# Patient Record
Sex: Female | Born: 1990 | Race: Black or African American | Hispanic: No | Marital: Single | Smoking: Never smoker
Health system: Southern US, Community
[De-identification: ages and names within clinical notes are randomized; demographics above are authoritative.]

## PROBLEM LIST (undated history)

## (undated) DIAGNOSIS — T7840XA Allergy, unspecified, initial encounter: Secondary | ICD-10-CM

## (undated) DIAGNOSIS — I10 Essential (primary) hypertension: Secondary | ICD-10-CM

## (undated) DIAGNOSIS — F419 Anxiety disorder, unspecified: Secondary | ICD-10-CM

## (undated) DIAGNOSIS — F32A Depression, unspecified: Secondary | ICD-10-CM

## (undated) HISTORY — PX: UPPER GASTROINTESTINAL ENDOSCOPY: SHX188

## (undated) HISTORY — DX: Allergy, unspecified, initial encounter: T78.40XA

## (undated) HISTORY — DX: Depression, unspecified: F32.A

## (undated) HISTORY — DX: Essential (primary) hypertension: I10

## (undated) HISTORY — DX: Anxiety disorder, unspecified: F41.9

---

## 2018-11-15 DIAGNOSIS — H5203 Hypermetropia, bilateral: Secondary | ICD-10-CM | POA: Diagnosis not present

## 2018-11-15 DIAGNOSIS — H52223 Regular astigmatism, bilateral: Secondary | ICD-10-CM | POA: Diagnosis not present

## 2018-12-25 DIAGNOSIS — Z01419 Encounter for gynecological examination (general) (routine) without abnormal findings: Secondary | ICD-10-CM | POA: Diagnosis not present

## 2018-12-25 DIAGNOSIS — Z1389 Encounter for screening for other disorder: Secondary | ICD-10-CM | POA: Diagnosis not present

## 2018-12-25 DIAGNOSIS — Z6832 Body mass index (BMI) 32.0-32.9, adult: Secondary | ICD-10-CM | POA: Diagnosis not present

## 2018-12-25 DIAGNOSIS — Z3202 Encounter for pregnancy test, result negative: Secondary | ICD-10-CM | POA: Diagnosis not present

## 2018-12-25 DIAGNOSIS — Z113 Encounter for screening for infections with a predominantly sexual mode of transmission: Secondary | ICD-10-CM | POA: Diagnosis not present

## 2018-12-25 DIAGNOSIS — Z124 Encounter for screening for malignant neoplasm of cervix: Secondary | ICD-10-CM | POA: Diagnosis not present

## 2019-01-22 ENCOUNTER — Ambulatory Visit: Payer: BC Managed Care – PPO | Attending: Internal Medicine

## 2019-01-22 ENCOUNTER — Other Ambulatory Visit: Payer: Self-pay

## 2019-01-22 DIAGNOSIS — Z20822 Contact with and (suspected) exposure to covid-19: Secondary | ICD-10-CM

## 2019-01-25 LAB — NOVEL CORONAVIRUS, NAA: SARS-CoV-2, NAA: DETECTED — AB

## 2019-08-20 DIAGNOSIS — Z6835 Body mass index (BMI) 35.0-35.9, adult: Secondary | ICD-10-CM | POA: Diagnosis not present

## 2019-08-20 DIAGNOSIS — Z124 Encounter for screening for malignant neoplasm of cervix: Secondary | ICD-10-CM | POA: Diagnosis not present

## 2019-08-20 DIAGNOSIS — Z113 Encounter for screening for infections with a predominantly sexual mode of transmission: Secondary | ICD-10-CM | POA: Diagnosis not present

## 2019-08-20 DIAGNOSIS — Z01419 Encounter for gynecological examination (general) (routine) without abnormal findings: Secondary | ICD-10-CM | POA: Diagnosis not present

## 2019-08-28 DIAGNOSIS — Z6835 Body mass index (BMI) 35.0-35.9, adult: Secondary | ICD-10-CM | POA: Diagnosis not present

## 2019-08-28 DIAGNOSIS — R102 Pelvic and perineal pain: Secondary | ICD-10-CM | POA: Diagnosis not present

## 2019-08-30 DIAGNOSIS — R10816 Epigastric abdominal tenderness: Secondary | ICD-10-CM | POA: Diagnosis not present

## 2019-08-30 DIAGNOSIS — F418 Other specified anxiety disorders: Secondary | ICD-10-CM | POA: Diagnosis not present

## 2019-08-30 DIAGNOSIS — Z13220 Encounter for screening for lipoid disorders: Secondary | ICD-10-CM | POA: Diagnosis not present

## 2019-08-30 DIAGNOSIS — F1211 Cannabis abuse, in remission: Secondary | ICD-10-CM | POA: Diagnosis not present

## 2019-08-30 DIAGNOSIS — R030 Elevated blood-pressure reading, without diagnosis of hypertension: Secondary | ICD-10-CM | POA: Diagnosis not present

## 2019-09-06 DIAGNOSIS — R1013 Epigastric pain: Secondary | ICD-10-CM | POA: Diagnosis not present

## 2019-09-06 DIAGNOSIS — K824 Cholesterolosis of gallbladder: Secondary | ICD-10-CM | POA: Diagnosis not present

## 2019-09-06 DIAGNOSIS — R10816 Epigastric abdominal tenderness: Secondary | ICD-10-CM | POA: Diagnosis not present

## 2019-09-27 DIAGNOSIS — Z23 Encounter for immunization: Secondary | ICD-10-CM | POA: Diagnosis not present

## 2019-09-27 DIAGNOSIS — R10816 Epigastric abdominal tenderness: Secondary | ICD-10-CM | POA: Diagnosis not present

## 2019-09-27 DIAGNOSIS — Z20822 Contact with and (suspected) exposure to covid-19: Secondary | ICD-10-CM | POA: Diagnosis not present

## 2019-10-11 ENCOUNTER — Other Ambulatory Visit: Payer: Self-pay | Admitting: Physician Assistant

## 2019-10-11 DIAGNOSIS — R1013 Epigastric pain: Secondary | ICD-10-CM

## 2019-10-11 DIAGNOSIS — R1030 Lower abdominal pain, unspecified: Secondary | ICD-10-CM

## 2019-10-11 DIAGNOSIS — R748 Abnormal levels of other serum enzymes: Secondary | ICD-10-CM | POA: Diagnosis not present

## 2019-10-14 ENCOUNTER — Encounter (HOSPITAL_COMMUNITY): Payer: Self-pay | Admitting: Emergency Medicine

## 2019-10-14 ENCOUNTER — Emergency Department (HOSPITAL_COMMUNITY)
Admission: EM | Admit: 2019-10-14 | Discharge: 2019-10-15 | Disposition: A | Discharge status: Home / Self Care | Payer: BC Managed Care – PPO | Attending: Emergency Medicine | Admitting: Emergency Medicine

## 2019-10-14 ENCOUNTER — Other Ambulatory Visit: Payer: Self-pay

## 2019-10-14 DIAGNOSIS — R1084 Generalized abdominal pain: Secondary | ICD-10-CM

## 2019-10-14 DIAGNOSIS — R109 Unspecified abdominal pain: Secondary | ICD-10-CM | POA: Diagnosis not present

## 2019-10-14 LAB — LIPASE, BLOOD: Lipase: 23 U/L (ref 11–51)

## 2019-10-14 LAB — COMPREHENSIVE METABOLIC PANEL
ALT: 17 U/L (ref 0–44)
AST: 18 U/L (ref 15–41)
Albumin: 3.7 g/dL (ref 3.5–5.0)
Alkaline Phosphatase: 65 U/L (ref 38–126)
Anion gap: 9 (ref 5–15)
BUN: 6 mg/dL (ref 6–20)
CO2: 26 mmol/L (ref 22–32)
Calcium: 9.2 mg/dL (ref 8.9–10.3)
Chloride: 106 mmol/L (ref 98–111)
Creatinine, Ser: 1 mg/dL (ref 0.44–1.00)
GFR calc Af Amer: >60 mL/min (ref >60)
GFR calc non Af Amer: >60 mL/min (ref >60)
Glucose, Bld: 120 mg/dL — ABNORMAL HIGH (ref 70–99)
Potassium: 3.6 mmol/L (ref 3.5–5.1)
Sodium: 141 mmol/L (ref 135–145)
Total Bilirubin: 0.6 mg/dL (ref 0.3–1.2)
Total Protein: 7.6 g/dL (ref 6.5–8.1)

## 2019-10-14 LAB — CBC
HCT: 39.6 % (ref 36.0–46.0)
Hemoglobin: 12.5 g/dL (ref 12.0–15.0)
MCH: 25.1 pg — ABNORMAL LOW (ref 26.0–34.0)
MCHC: 31.6 g/dL (ref 30.0–36.0)
MCV: 79.4 fL — ABNORMAL LOW (ref 80.0–100.0)
Platelets: 382 10*3/uL (ref 150–400)
RBC: 4.99 MIL/uL (ref 3.87–5.11)
RDW: 13.5 % (ref 11.5–15.5)
WBC: 5.2 10*3/uL (ref 4.0–10.5)
nRBC: 0 % (ref 0.0–0.2)

## 2019-10-14 LAB — I-STAT BETA HCG BLOOD, ED (MC, WL, AP ONLY): I-stat hCG, quantitative: <5 m[IU]/mL (ref <5)

## 2019-10-14 MED ORDER — ACETAMINOPHEN 325 MG PO TABS
650.0000 mg | ORAL_TABLET | Freq: Once | ORAL | Status: AC
  Administered 2019-10-14: 650 mg via ORAL
  Filled 2019-10-14: qty 2

## 2019-10-14 NOTE — ED Triage Notes (Signed)
Onset 3-4 months developed abdominal pain and recently pain worsening overtime. Seen doctor and blood work taken 3 days ago. Doctor called patient to go to the ED abnormal blood work Lipase 251. States had nausea only at this time.

## 2019-10-14 NOTE — ED Notes (Signed)
Went to give pt. Her Tylenol and another pt. Had already given her some Tylenol

## 2019-10-14 NOTE — ED Notes (Signed)
Pt. Requesting tylenol for pain

## 2019-10-15 ENCOUNTER — Emergency Department (HOSPITAL_COMMUNITY): Payer: BC Managed Care – PPO

## 2019-10-15 DIAGNOSIS — R109 Unspecified abdominal pain: Secondary | ICD-10-CM | POA: Diagnosis not present

## 2019-10-15 LAB — URINALYSIS, ROUTINE W REFLEX MICROSCOPIC
Bilirubin Urine: NEGATIVE
Glucose, UA: NEGATIVE mg/dL
Hgb urine dipstick: NEGATIVE
Ketones, ur: NEGATIVE mg/dL
Leukocytes,Ua: NEGATIVE
Nitrite: NEGATIVE
Protein, ur: NEGATIVE mg/dL
Specific Gravity, Urine: 1.025 (ref 1.005–1.030)
pH: 6 (ref 5.0–8.0)

## 2019-10-15 MED ORDER — IOHEXOL 350 MG/ML SOLN
100.0000 mL | Freq: Once | INTRAVENOUS | Status: AC | PRN
  Administered 2019-10-15: 100 mL via INTRAVENOUS

## 2019-10-15 MED ORDER — ACETAMINOPHEN 325 MG PO TABS
650.0000 mg | ORAL_TABLET | Freq: Once | ORAL | Status: AC
  Administered 2019-10-15: 650 mg via ORAL
  Filled 2019-10-15: qty 2

## 2019-10-15 NOTE — Discharge Instructions (Signed)
Please read and follow all provided instructions.  Your diagnoses today include:  1. Generalized abdominal pain     Tests performed today include:  Blood counts and electrolytes  Blood tests to check liver and kidney function  Blood tests to check pancreas function - was normal today  Urine test to look for infection and pregnancy (in women)  CT scan of the abdomen - shows some mildly swollen lymph nodes in the abdomen, otherwise the scan is normal  Vital signs. See below for your results today.   Medications prescribed:   None  Take any prescribed medications only as directed.  Home care instructions:   Follow any educational materials contained in this packet.  Follow-up instructions: Please follow-up with your primary care provider or GI doctor in the next 3 days for further evaluation of your symptoms.    Return instructions:  SEEK IMMEDIATE MEDICAL ATTENTION IF:  The pain does not go away or becomes severe   A temperature above 101F develops   Repeated vomiting occurs (multiple episodes)   The pain becomes localized to portions of the abdomen. The right side could possibly be appendicitis. In an adult, the left lower portion of the abdomen could be colitis or diverticulitis.   Blood is being passed in stools or vomit (bright red or black tarry stools)   You develop chest pain, difficulty breathing, dizziness or fainting, or become confused, poorly responsive, or inconsolable (young children)  If you have any other emergent concerns regarding your health  Additional Information: Abdominal (belly) pain can be caused by many things. Your caregiver performed an examination and possibly ordered blood/urine tests and imaging (CT scan, x-rays, ultrasound). Many cases can be observed and treated at home after initial evaluation in the emergency department. Even though you are being discharged home, abdominal pain can be unpredictable. Therefore, you need a repeated  exam if your pain does not resolve, returns, or worsens. Most patients with abdominal pain don't have to be admitted to the hospital or have surgery, but serious problems like appendicitis and gallbladder attacks can start out as nonspecific pain. Many abdominal conditions cannot be diagnosed in one visit, so follow-up evaluations are very important.  Your vital signs today were: BP (!) 154/114 (BP Location: Right Arm)   Pulse 99   Temp 98.5 F (36.9 C) (Oral)   Resp 12   Ht 5\' 4"  (1.626 m)   Wt 92.5 kg   SpO2 98%   BMI 35.02 kg/m  If your blood pressure (bp) was elevated above 135/85 this visit, please have this repeated by your doctor within one month. --------------

## 2019-10-15 NOTE — ED Provider Notes (Signed)
[facility] EMERGENCY DEPARTMENT Provider Note   CSN: [id number] Arrival date & time: 10/14/19  1547     History Chief Complaint  Patient presents with  . Abdominal Pain    Dorothy Osborne is a 29 y.o. female.  Patient with no past surgical history presents to the emergency department for evaluation of abdominal pain and elevated lipase.  Patient states that about a year ago she began having cramping in her lower abdomen.  Initially she went to her GYN who did a work-up including ultrasounds.  Working diagnosis was endometriosis per patient.  However several months ago patient began having upper abdominal pains as well.  She followed up with her PCP and then [facility].  In August patient states that she had a mildly elevated lipase level.  When she went to GI several days ago, this increased to 3 times the upper limit of normal.  She was encouraged to come to the emergency department for imaging.  Initially, they were going to do an outpatient CT head in the next 1 to 2 weeks.  Patient has had persistent abdominal pain described as cramping in the upper abdomen.  She has had nausea and decreased appetite but no persistent vomiting.  No chest pain or shortness of breath.  No urinary symptoms.  She states that the texture of her stool has been different recently and it has been lighter in color, but she has not noticed any black or bloody stools.  No treatments prior to arrival other than Tylenol.  Patient denies alcohol use or history of gallbladder disease.  She does not have a known history of high cholesterol or IBD.         History reviewed. No pertinent past medical history.  There are no problems to display for this patient.   History reviewed. No pertinent surgical history.   OB History   No obstetric history on file.     No family history on file.  Social History   Tobacco Use  . Smoking status: Never Smoker  Substance Use Topics  . Alcohol use: Never   . Drug use: Never    Home Medications Prior to Admission medications   Not on File    Allergies    Patient has no known allergies.  Review of Systems   Review of Systems  Constitutional: Positive for appetite change. Negative for fever.  HENT: Negative for rhinorrhea and sore throat.   Eyes: Negative for redness.  Respiratory: Negative for cough.   Cardiovascular: Negative for chest pain.  Gastrointestinal: Positive for abdominal pain and nausea. Negative for blood in stool, constipation, diarrhea and vomiting.  Genitourinary: Positive for pelvic pain. Negative for dysuria, frequency, hematuria, urgency, vaginal bleeding and vaginal discharge.  Musculoskeletal: Negative for myalgias.  Skin: Negative for rash.  Neurological: Negative for headaches.    Physical Exam Updated Vital Signs BP (!) 157/93 (BP Location: Right Arm)   Pulse 80   Temp 98.5 F (36.9 C) (Oral)   Resp 14   Ht 5\' 4"  (1.626 m)   Wt 92.5 kg   SpO2 100%   BMI 35.02 kg/m   Physical Exam Vitals and nursing note reviewed.  Constitutional:      General: She is not in acute distress.    Appearance: She is well-developed.  HENT:     Head: Normocephalic and atraumatic.     Right Ear: External ear normal.     Left Ear: External ear normal.  Nose: Nose normal.  Eyes:     Conjunctiva/sclera: Conjunctivae normal.  Cardiovascular:     Rate and Rhythm: Normal rate and regular rhythm.     Heart sounds: No murmur heard.   Pulmonary:     Effort: No respiratory distress.     Breath sounds: No wheezing, rhonchi or rales.  Abdominal:     Palpations: Abdomen is soft.     Tenderness: There is generalized abdominal tenderness (mild). There is no guarding or rebound.  Musculoskeletal:     Cervical back: Normal range of motion and neck supple.     Right lower leg: No edema.     Left lower leg: No edema.  Skin:    General: Skin is warm and dry.     Coloration: Skin is not jaundiced.     Findings: No rash.   Neurological:     General: No focal deficit present.     Mental Status: She is alert. Mental status is at baseline.     Motor: No weakness.  Psychiatric:        Mood and Affect: Mood normal.     ED Results / Procedures / Treatments   Labs (all labs ordered are listed, but only abnormal results are displayed) Labs Reviewed  COMPREHENSIVE METABOLIC PANEL - Abnormal; Notable for the following components:      Result Value   Glucose, Bld 120 (*)    All other components within normal limits  CBC - Abnormal; Notable for the following components:   MCV 79.4 (*)    MCH 25.1 (*)    All other components within normal limits  URINALYSIS, ROUTINE W REFLEX MICROSCOPIC - Abnormal; Notable for the following components:   APPearance HAZY (*)    All other components within normal limits  LIPASE, BLOOD  I-STAT BETA HCG BLOOD, ED ([facility], [facility], [facility] ONLY)    EKG None  Radiology CT ABDOMEN PELVIS W CONTRAST  Result Date: 10/15/2019 CLINICAL DATA:  Abdominal pain.  Intermittent lipase elevation EXAM: CT ABDOMEN AND PELVIS WITH CONTRAST TECHNIQUE: Multidetector CT imaging of the abdomen and pelvis was performed using the standard protocol following bolus administration of intravenous contrast. CONTRAST:  OMNIPAQUE IOHEXOL 350 MG/ML SOLN COMPARISON:  None. FINDINGS: Lower chest: Lung bases are clear. Hepatobiliary: No focal liver lesions are apparent. Gallbladder wall is not appreciably thickened. There is no biliary duct dilatation. Pancreas: There is no pancreatic mass or inflammatory focus. No peripancreatic fluid or duct dilatation. Spleen: No splenic lesions evident. Adrenals/Urinary Tract: Adrenals bilaterally appear normal. Kidneys bilaterally show no evident mass or hydronephrosis on either side. No renal or ureteral calculi are evident on either side. Urinary bladder is midline with wall thickness within normal limits. Stomach/Bowel: No appreciable bowel wall or mesenteric thickening. Terminal  ileum appears normal. There is fatty infiltration in the ileocecal valve. No evident bowel obstruction. There is no appreciable free air or portal venous air. Vascular/Lymphatic: No abdominal aortic aneurysm. No arterial vascular lesions are demonstrable. Major venous structures appear patent. There is no evident adenopathy in the abdomen or pelvis by size criteria. There are subcentimeter lymph nodes in the mid right abdomen, considered nonspecific. Subcentimeter inguinal lymph nodes also noted, considered nonspecific. Reproductive: The uterus is anteverted. There is a probable dominant follicle in the right ovary measuring 1.2 x 1.2 cm. Beyond this apparent follicle, there is no pelvic mass. Other: The appendix appears normal. No abscess or ascites is evident in the abdomen or pelvis. Slight fat noted in the  umbilicus. Musculoskeletal: No blastic or lytic bone lesions. No intramuscular or abdominal wall lesions are evident. IMPRESSION: 1. Subcentimeter mesenteric lymph nodes, considered nonspecific. In the appropriate clinical setting, this finding potentially could indicate a degree of mesenteric adenitis. Note that by size criteria there is no adenopathy in the abdomen or pelvis. 2. No bowel wall thickening or bowel obstruction. Appendix appears normal. No abscess evident in the abdomen or pelvis. 3. No renal or ureteral calculus. No hydronephrosis on either side. Urinary bladder wall thickness normal. Electronically Signed   By: Bretta Bang III M.D.   On: 10/15/2019 10:51    Procedures Procedures (including critical care time)  Medications Ordered in ED Medications  acetaminophen (TYLENOL) tablet 650 mg (has no administration in time range)  acetaminophen (TYLENOL) tablet 650 mg (650 mg Oral Given 10/14/19 2007)    ED Course  I have reviewed the triage vital signs and the nursing notes.  Pertinent labs & imaging results that were available during my care of the patient were reviewed by me  and considered in my medical decision making (see chart for details).  Patient seen and examined. Work-up reviewed.  Lipase here was normal.  Patient continues to have complaints of abdominal pain.  She does not want anything other than Tylenol for this.  Will obtain CT imaging of the abdomen and pelvis with contrast.  Fortunately, labs look good here.  She is established with [facility] and has plans to follow-up.   Vital signs reviewed and are as follows: BP (!) 157/93 (BP Location: Right Arm)   Pulse 80   Temp 98.5 F (36.9 C) (Oral)   Resp 14   Ht 5\' 4"  (1.626 m)   Wt 92.5 kg   SpO2 100%   BMI 35.02 kg/m   11:41 AM CT personally reviewed.  CT scan is largely reassuring.  Patient informed of results.  She seems reassured.  Pain is currently controlled and she appears comfortable.  Current plan is for discharge and to continue follow-up as outpatient with GI and PCP.  The patient was urged to return to the Emergency Department immediately with worsening of current symptoms, worsening abdominal pain, persistent vomiting, blood noted in stools, fever, or any other concerns. The patient verbalized understanding.     MDM Rules/Calculators/A&P                          Patient with ongoing nonspecific abdominal pain, sent today for imaging given recent elevation and lipase.  Lipase was normal here today.  Remainder of lab work was reassuring.  CT imaging without any concerning findings.  No indications for admission, emergent consultation, or other inpatient treatments here today.  Patient will continue close follow-up as outpatient.  Discussed return instructions as above.   Final Clinical Impression(s) / ED Diagnoses Final diagnoses:  Generalized abdominal pain    Rx / DC Orders ED Discharge Orders    None       , PA-C 10/15/19 1142    10/17/19, MD 10/15/19 1539

## 2019-10-24 ENCOUNTER — Other Ambulatory Visit: Payer: BC Managed Care – PPO

## 2020-10-17 HISTORY — PX: LAPAROSCOPY: SHX197

## 2021-01-05 IMAGING — CT CT ABD-PELV W/ CM
2 of 4 series · 16 of 46 positions shown, 18 images · IV contrast (omnipaque)
Comparison: None.

CLINICAL DATA: Abdominal pain.  Intermittent lipase elevation

EXAM:
CT ABDOMEN AND PELVIS WITH CONTRAST
TECHNIQUE: Multidetector CT imaging of the abdomen and pelvis was performed
using the standard protocol following bolus administration of
intravenous contrast.
CONTRAST:  100mL OMNIPAQUE IOHEXOL 350 MG/ML SOLN

[Series 3: a/p w/ 5mm · axial · 0.90mm/px · z∈[+930,+1350]mm · 13 of 92 slices shown, 15 images]
[im 4/92  soft-tissue]
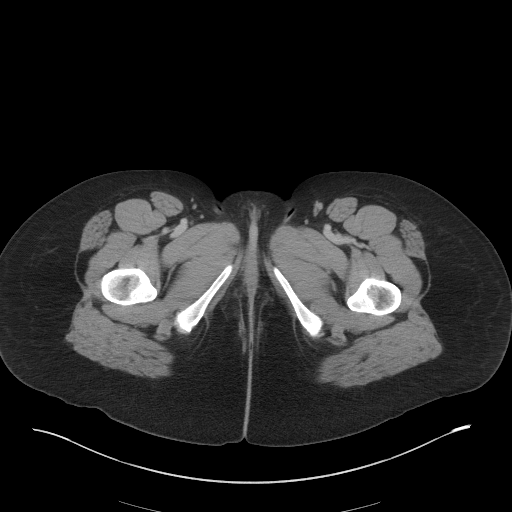
[im 4/92  bone]
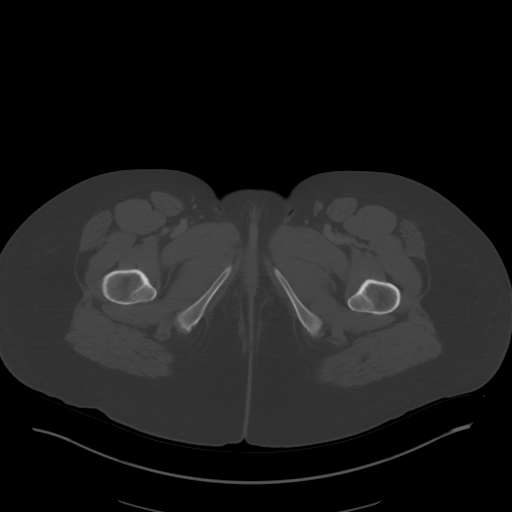
[im 12/92  soft-tissue]
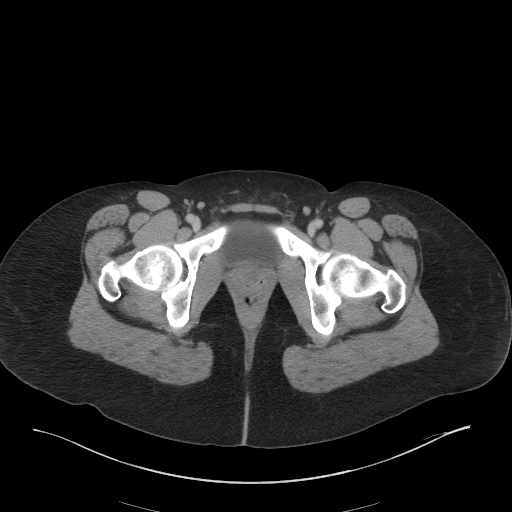
[im 19/92  soft-tissue]
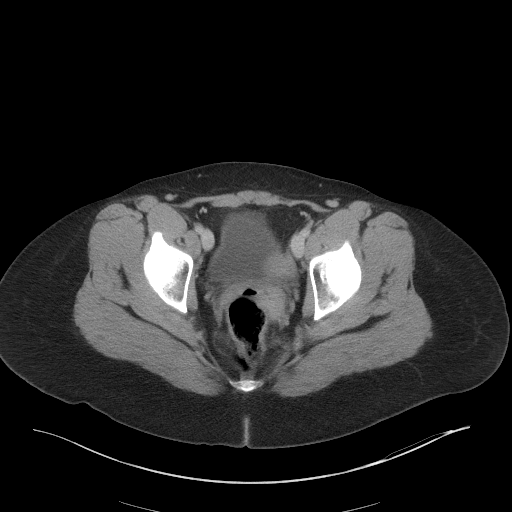
[im 27/92  soft-tissue]
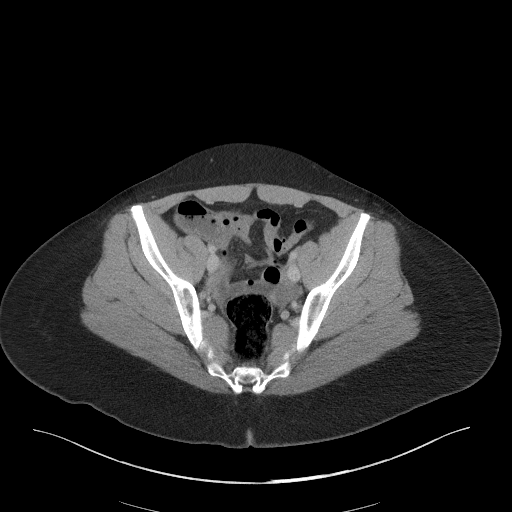
[im 31/92  soft-tissue]
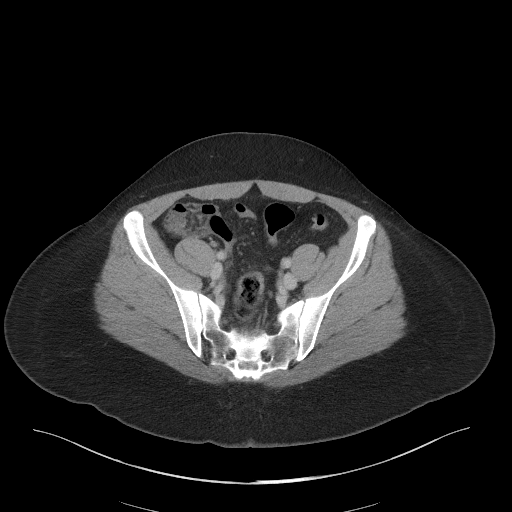
[im 38/92  soft-tissue]
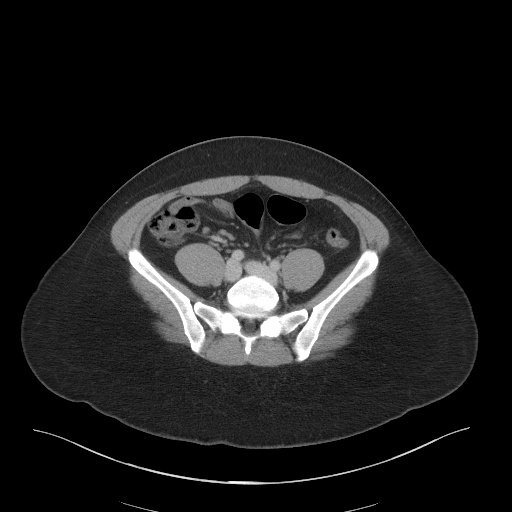
[im 46/92  soft-tissue]
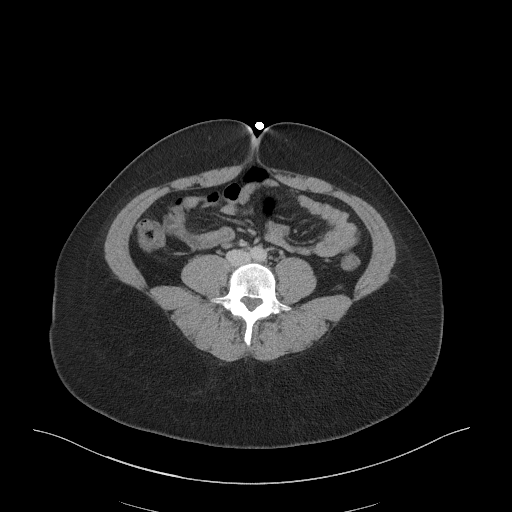
[im 54/92  soft-tissue]
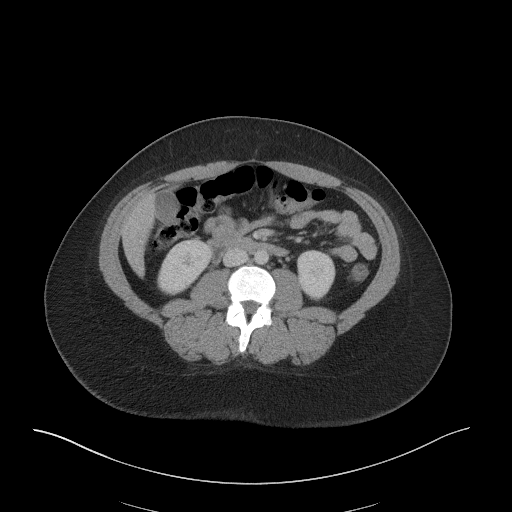
[im 61/92  soft-tissue]
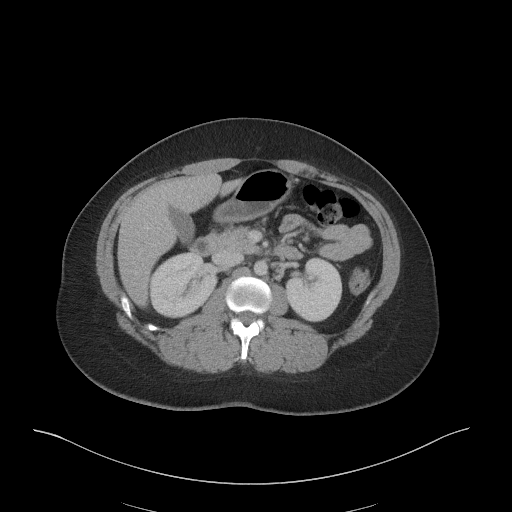
[im 61/92  bone]
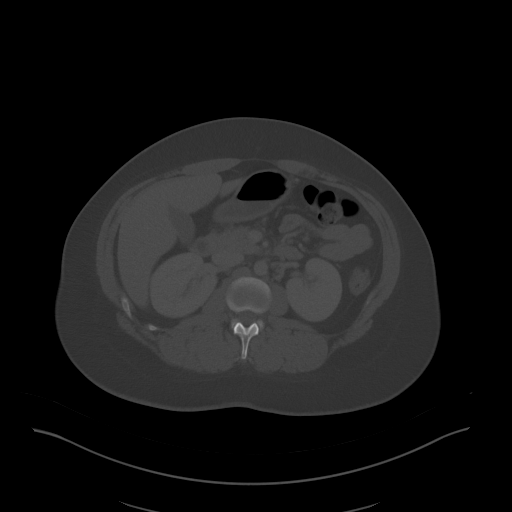
[im 65/92  soft-tissue]
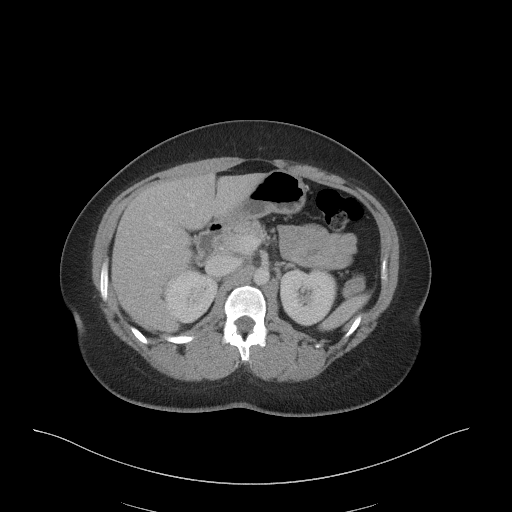
[im 73/92  soft-tissue]
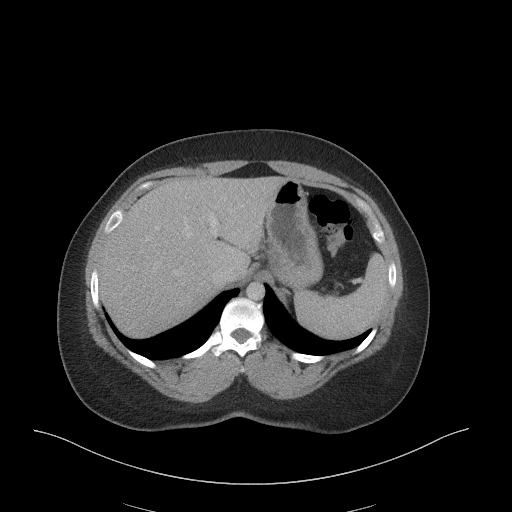
[im 80/92  soft-tissue]
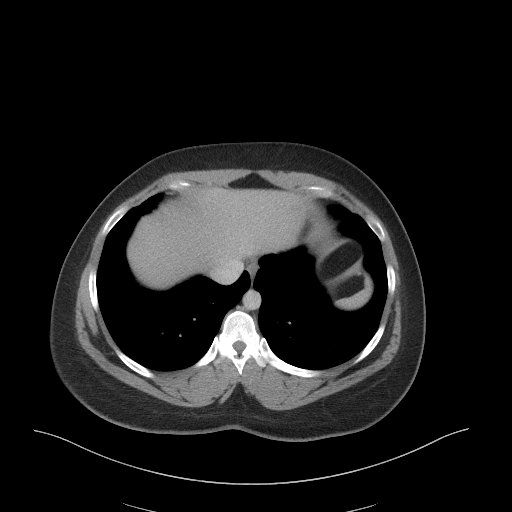
[im 88/92  soft-tissue]
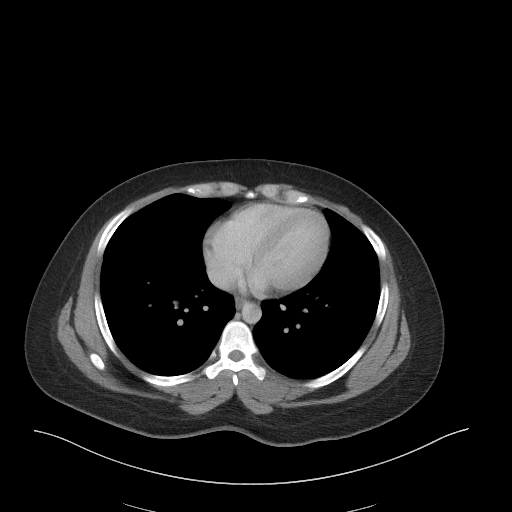

[Series 6: a/p w/ cor · coronal · 0.89mm/px · 3 of 151 slices shown]
[im 51/151  soft-tissue]
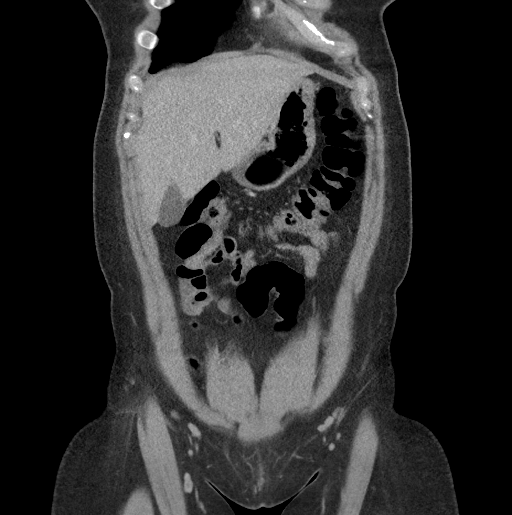
[im 67/151  soft-tissue]
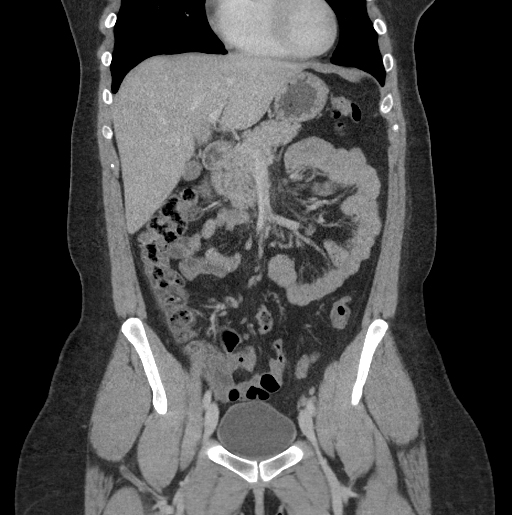
[im 84/151  soft-tissue]
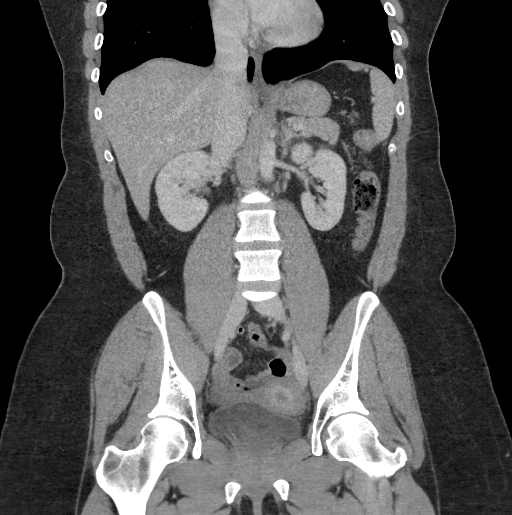

[16 of 46 positions shown; findings below may reference images not displayed]

FINDINGS: Lower chest: Lung bases are clear.

Hepatobiliary: No focal liver lesions are apparent. Gallbladder wall
is not appreciably thickened. There is no biliary duct dilatation.

Pancreas: There is no pancreatic mass or inflammatory focus. No
peripancreatic fluid or duct dilatation.

Spleen: No splenic lesions evident.

Adrenals/Urinary Tract: Adrenals bilaterally appear normal. Kidneys
bilaterally show no evident mass or hydronephrosis on either side.
No renal or ureteral calculi are evident on either side. Urinary
bladder is midline with wall thickness within normal limits.

Stomach/Bowel: No appreciable bowel wall or mesenteric thickening.
Terminal ileum appears normal. There is fatty infiltration in the
ileocecal valve. No evident bowel obstruction. There is no
appreciable free air or portal venous air.

Vascular/Lymphatic: No abdominal aortic aneurysm. No arterial
vascular lesions are demonstrable. Major venous structures appear
patent. There is no evident adenopathy in the abdomen or pelvis by
size criteria. There are subcentimeter lymph nodes in the mid right
abdomen, considered nonspecific. Subcentimeter inguinal lymph nodes
also noted, considered nonspecific.

Reproductive: The uterus is anteverted. There is a probable dominant
follicle in the right ovary measuring 1.2 x 1.2 cm. Beyond this
apparent follicle, there is no pelvic mass.

Other: The appendix appears normal. No abscess or ascites is evident
in the abdomen or pelvis. Slight fat noted in the umbilicus.

Musculoskeletal: No blastic or lytic bone lesions. No intramuscular
or abdominal wall lesions are evident.
IMPRESSION: 1. Subcentimeter mesenteric lymph nodes, considered nonspecific. In
the appropriate clinical setting, this finding potentially could
indicate a degree of mesenteric adenitis. Note that by size criteria
there is no adenopathy in the abdomen or pelvis.

2. No bowel wall thickening or bowel obstruction. Appendix appears
normal. No abscess evident in the abdomen or pelvis.

3. No renal or ureteral calculus. No hydronephrosis on either side.
Urinary bladder wall thickness normal.

## 2021-02-25 LAB — HM PAP SMEAR

## 2021-03-10 ENCOUNTER — Encounter: Payer: Self-pay | Admitting: Nurse Practitioner

## 2021-03-10 ENCOUNTER — Other Ambulatory Visit: Payer: Self-pay

## 2021-03-10 ENCOUNTER — Ambulatory Visit: Payer: Managed Care, Other (non HMO) | Admitting: Nurse Practitioner

## 2021-03-10 VITALS — BP 100/64 | HR 92 | Temp 97.7°F | Ht 64.5 in | Wt 194.6 lb

## 2021-03-10 DIAGNOSIS — B9681 Helicobacter pylori [H. pylori] as the cause of diseases classified elsewhere: Secondary | ICD-10-CM

## 2021-03-10 DIAGNOSIS — J301 Allergic rhinitis due to pollen: Secondary | ICD-10-CM

## 2021-03-10 DIAGNOSIS — K2970 Gastritis, unspecified, without bleeding: Secondary | ICD-10-CM

## 2021-03-10 DIAGNOSIS — R109 Unspecified abdominal pain: Secondary | ICD-10-CM | POA: Diagnosis not present

## 2021-03-10 DIAGNOSIS — G8929 Other chronic pain: Secondary | ICD-10-CM | POA: Diagnosis not present

## 2021-03-10 DIAGNOSIS — E282 Polycystic ovarian syndrome: Secondary | ICD-10-CM | POA: Insufficient documentation

## 2021-03-10 MED ORDER — OMEPRAZOLE 20 MG PO CPDR: 20.0000 mg | DELAYED_RELEASE_CAPSULE | Freq: Two times a day (BID) | ORAL | 0 refills | Status: DC

## 2021-03-10 MED ORDER — AZELASTINE HCL 0.1 % NA SOLN: 1.0000 | Freq: Two times a day (BID) | NASAL | 5 refills | Status: DC

## 2021-03-10 NOTE — Patient Instructions (Signed)
Thank you for choosing [facility]  Go to lab for breath test  Start omeprazole and azelastine as prescribed  You will be contacted to schedule appt with GI and allergist.  Food Choices for Gastroesophageal Reflux Disease, Adult When you have gastroesophageal reflux disease (GERD), the foods you eat and your eating habits are very important. Choosing the right foods can help ease your discomfort. Think about working with a food expert (dietitian) to help you make good choices. What are tips for following this plan? Reading food labels Look for foods that are low in saturated fat. Foods that may help with your symptoms include: Foods that have less than 5% of daily value (DV) of fat. Foods that have 0 grams of trans fat. Cooking Do not fry your food. Cook your food by baking, steaming, grilling, or broiling. These are all methods that do not need a lot of fat for cooking. To add flavor, try to use herbs that are low in spice and acidity. Meal planning  Choose healthy foods that are low in fat, such as: Fruits and vegetables. Whole grains. Low-fat dairy products. Lean meats, fish, and poultry. Eat small meals often instead of eating 3 large meals each day. Eat your meals slowly in a place where you are relaxed. Avoid bending over or lying down until 2-3 hours after eating. Limit high-fat foods such as fatty meats or fried foods. Limit your intake of fatty foods, such as oils, butter, and shortening. Avoid the following as told by your doctor: Foods that cause symptoms. These may be different for different people. Keep a food diary to keep track of foods that cause symptoms. Alcohol. Drinking a lot of liquid with meals. Eating meals during the 2-3 hours before bed. Lifestyle Stay at a healthy weight. Ask your doctor what weight is healthy for you. If you need to lose weight, work with your doctor to do so safely. Exercise for at least 30 minutes on 5 or more days each week,  or as told by your doctor. Wear loose-fitting clothes. Do not smoke or use any products that contain nicotine or tobacco. If you need help quitting, ask your doctor. Sleep with the head of your bed higher than your feet. Use a wedge under the mattress or blocks under the bed frame to raise the head of the bed. Chew sugar-free gum after meals. What foods should eat? Eat a healthy, well-balanced diet of fruits, vegetables, whole grains, low-fat dairy products, lean meats, fish, and poultry. Each person is different. Foods that may cause symptoms in one person may not cause any symptoms in another person. Work with your doctor to find foods that are safe for you. The items listed above may not be a complete list of what you can eat and drink. Contact a food expert for more options. What foods should I avoid? Limiting some of these foods may help in managing the symptoms of GERD. Everyone is different. Talk with a food expert or your doctor to help you find the exact foods to avoid, if any. Fruits Any fruits prepared with added fat. Any fruits that cause symptoms. For some people, this may include citrus fruits, such as oranges, grapefruit, pineapple, and lemons. Vegetables Deep-fried vegetables. [place] fries. Any vegetables prepared with added fat. Any vegetables that cause symptoms. For some people, this may include tomatoes and tomato products, chili peppers, onions and garlic, and horseradish. Grains Pastries or quick breads with added fat. Meats and other proteins High-fat meats,  such as fatty beef or pork, hot dogs, ribs, ham, sausage, salami, and bacon. Fried meat or protein, including fried fish and fried chicken. Nuts and nut butters, in large amounts. Dairy Whole milk and chocolate milk. Sour cream. Cream. Ice cream. Cream cheese. Milkshakes. Fats and oils Butter. Margarine. Shortening. Ghee. Beverages Coffee and tea, with or without caffeine. Carbonated beverages. Sodas. Energy drinks.  Fruit juice made with acidic fruits, such as orange or grapefruit. Tomato juice. Alcoholic drinks. Sweets and desserts Chocolate and cocoa. Donuts. Seasonings and condiments Pepper. Peppermint and spearmint. Added salt. Any condiments, herbs, or seasonings that cause symptoms. For some people, this may include curry, hot sauce, or vinegar-based salad dressings. The items listed above may not be a complete list of what you should not eat and drink. Contact a food expert for more options. Questions to ask your doctor Diet and lifestyle changes are often the first steps that are taken to manage symptoms of GERD. If diet and lifestyle changes do not help, talk with your doctor about taking medicines. Where to find more information [employer] for Gastrointestinal [employer]: aboutgerd.org Summary When you have GERD, food and lifestyle choices are very important in easing your symptoms. Eat small meals often instead of 3 large meals a day. Eat your meals slowly and in a place where you are relaxed. Avoid bending over or lying down until 2-3 hours after eating. Limit high-fat foods such as fatty meats or fried foods. This information is not intended to replace advice given to you by your health care provider. Make sure you discuss any questions you have with your health care provider. Document Revised: 07/15/2019 Document Reviewed: 07/15/2019 Elsevier Patient Education  [employer].

## 2021-03-10 NOTE — Assessment & Plan Note (Addendum)
Onset over 54months ago, epigastric to mid abdominal pain which radiates to pelvic region. Waxing and waning. Describes as intermittent burning, dull to sharp pain Denies any constipation of diarrhea eval by GYN with [facility]: reviewed OV notes and exploratory laparoscopic surgical notes. CT ABD/pelvis (No abnormal finding).  Check H. pylori Start omeprazole BID x 14days Provided information on GERD diet Entered GI referral

## 2021-03-10 NOTE — Assessment & Plan Note (Signed)
Onset at age 31. Triggers: pollen and dust. Waxing and waning, worse in spring season Symptoms: sneezing, itchy and watery eyes, nasal congestion, recurrent sinus infection No improvement with claritin and zrytec. Some improvement with flonase and air purifier at home No hx of asthma or eczema  Maintain flonase spray Add azelastine nasal spray BID, saline rinse prn, and enetered referral to allergist. Change home filter every 53months, dust and vacuum home frequently, keep windows closed.

## 2021-03-10 NOTE — Progress Notes (Addendum)
Subjective:  Patient ID: Dorothy Osborne, female    DOB: 04-Dec-1990  Age: 31 y.o. MRN: [id number]  CC: Establish Care (New patient/Pt would like to discuss getting a Rx for her allergies because the Claritin she was taking is no longer working. Pt has some female concerns and would like to discuss.)  HPI No  previous pcp.  Seasonal allergic rhinitis due to pollen Onset at age 66. Triggers: pollen and dust. Waxing and waning, worse in spring season Symptoms: sneezing, itchy and watery eyes, nasal congestion, recurrent sinus infection No improvement with claritin and zrytec. Some improvement with flonase and air purifier at home No hx of asthma or eczema  Maintain flonase spray Add azelastine nasal spray BID, saline rinse prn, and enetered referral to allergist. Change home filter every 6months, dust and vacuum home frequently, keep windows closed.  Chronic abdominal pain Onset over 38months ago, epigastric to mid abdominal pain which radiates to pelvic region. Waxing and waning. Describes as intermittent burning, dull to sharp pain Denies any constipation of diarrhea eval by GYN with [facility]: reviewed OV notes and exploratory laparoscopic surgical notes. CT ABD/pelvis (No abnormal finding).  Check H. pylori Start omeprazole BID x 14days Provided information on GERD diet Entered GI referral  Reviewed past Medical, Social and Family history today.  Outpatient Medications Prior to Visit  Medication Sig Dispense Refill   loratadine (CLARITIN) 10 MG tablet Take 10 mg by mouth as needed for allergies. (Patient not taking: Reported on 03/10/2021)     pantoprazole (PROTONIX) 40 MG tablet Take 40 mg by mouth daily. (Patient not taking: Reported on 03/10/2021)     No facility-administered medications prior to visit.    ROS See HPI  Objective:  BP 100/64 (BP Location: Left Arm, Patient Position: Sitting, Cuff Size: Normal)    Pulse 92    Temp 97.7 F (36.5 C) (Temporal)    Ht 5' 4.5"  (1.638 m)    Wt 194 lb 9.6 oz (88.3 kg)    LMP 02/05/2021 (Approximate)    SpO2 98%    BMI 32.89 kg/m   Physical Exam Eyes:     General: No allergic shiner.    Conjunctiva/sclera: Conjunctivae normal.  Cardiovascular:     Rate and Rhythm: Normal rate.     Pulses: Normal pulses.  Pulmonary:     Effort: Pulmonary effort is normal.  Abdominal:     General: Abdomen is flat.     Tenderness: There is abdominal tenderness in the epigastric area. There is no right CVA tenderness, left CVA tenderness or guarding.  Neurological:     Mental Status: She is alert and oriented to person, place, and time.   Assessment & Plan:  This visit occurred during the SARS-CoV-2 public health emergency.  Safety protocols were in place, including screening questions prior to the visit, additional usage of staff PPE, and extensive cleaning of exam room while observing appropriate contact time as indicated for disinfecting solutions.   Dorothy Osborne was seen today for establish care.  Diagnoses and all orders for this visit:  Seasonal allergic rhinitis due to pollen -     azelastine (ASTELIN) 0.1 % nasal spray; Place 1 spray into both nostrils 2 (two) times daily. Use in each nostril as directed -     Ambulatory referral to Allergy  Chronic abdominal pain -     Ambulatory referral to Gastroenterology -     H. pylori breath test -     Discontinue: omeprazole (PRILOSEC) 20 MG capsule;  Take 1 capsule (20 mg total) by mouth 2 (two) times daily before a meal. -     clarithromycin (BIAXIN) 500 MG tablet; Take 1 tablet (500 mg total) by mouth 2 (two) times daily. -     amoxicillin (AMOXIL) 500 MG tablet; Take 2 tablets (1,000 mg total) by mouth 2 (two) times daily for 14 days. -     omeprazole (PRILOSEC) 20 MG capsule; Take 1 capsule (20 mg total) by mouth 2 (two) times daily before a meal.  Helicobacter pylori gastritis -     clarithromycin (BIAXIN) 500 MG tablet; Take 1 tablet (500 mg total) by mouth 2 (two) times  daily. -     amoxicillin (AMOXIL) 500 MG tablet; Take 2 tablets (1,000 mg total) by mouth 2 (two) times daily for 14 days. -     omeprazole (PRILOSEC) 20 MG capsule; Take 1 capsule (20 mg total) by mouth 2 (two) times daily before a meal. -     Helicobacter pylori special antigen; Future   Problem List Items Addressed This Visit       Respiratory   Seasonal allergic rhinitis due to pollen - Primary    Onset at age 86. Triggers: pollen and dust. Waxing and waning, worse in spring season Symptoms: sneezing, itchy and watery eyes, nasal congestion, recurrent sinus infection No improvement with claritin and zrytec. Some improvement with flonase and air purifier at home No hx of asthma or eczema  Maintain flonase spray Add azelastine nasal spray BID, saline rinse prn, and enetered referral to allergist. Change home filter every 19months, dust and vacuum home frequently, keep windows closed.      Relevant Medications   azelastine (ASTELIN) 0.1 % nasal spray   Other Relevant Orders   Ambulatory referral to Allergy     Other   Chronic abdominal pain    Onset over 11months ago, epigastric to mid abdominal pain which radiates to pelvic region. Waxing and waning. Describes as intermittent burning, dull to sharp pain Denies any constipation of diarrhea eval by GYN with [facility]: reviewed OV notes and exploratory laparoscopic surgical notes. CT ABD/pelvis (No abnormal finding).  Check H. pylori Start omeprazole BID x 14days Provided information on GERD diet Entered GI referral      Relevant Medications   clarithromycin (BIAXIN) 500 MG tablet   amoxicillin (AMOXIL) 500 MG tablet   omeprazole (PRILOSEC) 20 MG capsule   Other Relevant Orders   Ambulatory referral to Gastroenterology   H. pylori breath test (Completed)   Other Visit Diagnoses     Helicobacter pylori gastritis       Relevant Medications   clarithromycin (BIAXIN) 500 MG tablet   amoxicillin (AMOXIL) 500 MG tablet    omeprazole (PRILOSEC) 20 MG capsule   Other Relevant Orders   Helicobacter pylori special antigen       Follow-up: Return in about 2 weeks (around 03/24/2021) for CPE (fasting) and ABD pain.  Alysia Penna, NP

## 2021-03-11 LAB — H. PYLORI BREATH TEST: H. pylori Breath Test: DETECTED — AB

## 2021-03-11 MED ORDER — AMOXICILLIN 500 MG PO TABS: 1000.0000 mg | ORAL_TABLET | Freq: Two times a day (BID) | ORAL | 0 refills | Status: AC

## 2021-03-11 MED ORDER — OMEPRAZOLE 20 MG PO CPDR: 20.0000 mg | DELAYED_RELEASE_CAPSULE | Freq: Two times a day (BID) | ORAL | 0 refills | Status: DC

## 2021-03-11 MED ORDER — CLARITHROMYCIN 500 MG PO TABS: 500.0000 mg | ORAL_TABLET | Freq: Two times a day (BID) | ORAL | 0 refills | Status: DC

## 2021-03-11 NOTE — Addendum Note (Signed)
Addended by: Michaela Corner on: 03/11/2021 01:32 PM   Modules accepted: Orders

## 2021-03-18 ENCOUNTER — Other Ambulatory Visit: Payer: Self-pay | Admitting: Nurse Practitioner

## 2021-03-18 DIAGNOSIS — B9681 Helicobacter pylori [H. pylori] as the cause of diseases classified elsewhere: Secondary | ICD-10-CM

## 2021-03-18 DIAGNOSIS — R109 Unspecified abdominal pain: Secondary | ICD-10-CM

## 2021-03-18 DIAGNOSIS — G8929 Other chronic pain: Secondary | ICD-10-CM

## 2021-03-18 NOTE — Telephone Encounter (Signed)
Duplicate request ?Rx filled on 03/11/21 ?

## 2021-03-22 ENCOUNTER — Telehealth: Payer: Self-pay | Admitting: Nurse Practitioner

## 2021-03-22 DIAGNOSIS — J301 Allergic rhinitis due to pollen: Secondary | ICD-10-CM

## 2021-03-22 NOTE — Telephone Encounter (Signed)
Caller Name: Dorothy Osborne  ?Call back phone #: [phone] ? ?MEDICATION(S): azelastine (ASTELIN) 0.1 % nasal spray [[id number]]  ? ? ?Days of Med Remaining: she never got her script from 03/10/21 ? ?Has the patient contacted their pharmacy (YES/NO)?  yes ?IF YES, when and what did the pharmacy advise? They told her to call her PCP. Her insurance has not approved it by the way the pharmacist told her. ?IF NO, request that the patient contact the pharmacy for the refills in the future.  ?           The pharmacy will send an electronic request (except for controlled medications). ? ?Preferred Pharmacy: [id number] - [place], [place] - [address]  ?[address], [place] [address]  ?Phone:  [phone]  Fax:  [phone]  ?DEA #:  [id number] ? ?~~~Please advise patient/caregiver to allow 2-3 business days to process RX refills. ? ?

## 2021-03-24 ENCOUNTER — Other Ambulatory Visit: Payer: Self-pay | Admitting: Nurse Practitioner

## 2021-03-24 DIAGNOSIS — K2970 Gastritis, unspecified, without bleeding: Secondary | ICD-10-CM

## 2021-03-24 DIAGNOSIS — G8929 Other chronic pain: Secondary | ICD-10-CM

## 2021-03-30 MED ORDER — AZELASTINE HCL 0.1 % NA SOLN: 1.0000 | Freq: Two times a day (BID) | NASAL | 5 refills | Status: DC

## 2021-03-31 ENCOUNTER — Encounter: Payer: Self-pay | Admitting: Gastroenterology

## 2021-04-16 ENCOUNTER — Other Ambulatory Visit (INDEPENDENT_AMBULATORY_CARE_PROVIDER_SITE_OTHER): Payer: Managed Care, Other (non HMO)

## 2021-04-16 ENCOUNTER — Encounter: Payer: Self-pay | Admitting: Gastroenterology

## 2021-04-16 ENCOUNTER — Ambulatory Visit: Payer: Managed Care, Other (non HMO) | Admitting: Gastroenterology

## 2021-04-16 VITALS — BP 122/74 | HR 88 | Ht 64.0 in | Wt 199.2 lb

## 2021-04-16 DIAGNOSIS — A048 Other specified bacterial intestinal infections: Secondary | ICD-10-CM

## 2021-04-16 DIAGNOSIS — R1084 Generalized abdominal pain: Secondary | ICD-10-CM | POA: Diagnosis not present

## 2021-04-16 LAB — LIPASE: Lipase: 37 U/L (ref 11.0–59.0)

## 2021-04-16 NOTE — Patient Instructions (Signed)
If you are age 33 or older, your body mass index should be between 23-30. Your Body mass index is 34.19 kg/m?Marland Kitchen If this is out of the aforementioned range listed, please consider follow up with your Primary Care Provider. ? ?If you are age 46 or younger, your body mass index should be between 19-25. Your Body mass index is 34.19 kg/m?Marland Kitchen If this is out of the aformentioned range listed, please consider follow up with your Primary Care Provider.  ? ?Your provider has requested that you go to the basement level for lab work before leaving today. Press "B" on the elevator. The lab is located at the first door on the left as you exit the elevator. ? ?Please purchase Metamucil over the counter. Take as directed.  ? ?You have been scheduled for an endoscopy. Please follow written instructions given to you at your visit today. ?If you use inhalers (even only as needed), please bring them with you on the day of your procedure.  ? ?The [facility] providers would like to encourage you to use [facility] to communicate with providers for non-urgent requests or questions.  Due to long hold times on the telephone, sending your provider a message by [facility] may be a faster and more efficient way to get a response.  Please allow 48 business hours for a response.  Please remember that this is for non-urgent requests.  ? ?Due to recent changes in healthcare laws, you may see the results of your imaging and laboratory studies on MyChart before your provider has had a chance to review them.  We understand that in some cases there may be results that are confusing or concerning to you. Not all laboratory results come back in the same time frame and the provider may be waiting for multiple results in order to interpret others.  Please give [place] 48 hours in order for your provider to thoroughly review all the results before contacting the office for clarification of your results.  ? ?It was a pleasure to see you today! ? ?Thank you for trusting  me with your gastrointestinal care!   ? ?Scott E.Candis Schatz, MD  ? ?

## 2021-04-16 NOTE — Progress Notes (Signed)
? ?HPI : Dorothy Osborne is a very pleasant 31 year old female with a history of anxiety and depression who is referred to [place] by Alysia Penna, NP for further evaluation of chronic abdominal pain.  She says her pain first started in May 2021 when she developed abrupt onset left sided flank pain.   In Sept 2021 she states that she was found to have an elevated lipase level and was told to go to the ER.  In the ED her lipase was normal and an abdominal CT was unremarkable, with no evidence of pancreatitis.   ?Last year she was having constant lower abdominal/pelvic pain worsened with menses that was suspicious for endometriosis.   She ended up undergoing an exploratory laparatomy which was negative for endometriosis.   ?Her pain has improved since last year.  She still has pain attacks that are as severe as they were last year, but they are less frequent.  Instead of having pain everyday, she is experiencing the pain every 2-3 days.  It can be constant, lasting for hours, or it can come and go throughout the day.  It typically involves the lower abdomen, but can radiate into the legs, upper abdomen/chest and upper back.   ?The pain is often sharp in character, sometimes crampy.  It is almost always worse with her menses.  Other than her menses, she denies any other triggers.  No particular foods seem to make it worse.  It is not necessary worsened with eating or improved with bowel movements.  Sleeping will help with the pain and sometimes a heating pad.  Tylenol and other OTC analgesics do not help.   ?A trial of omeprazole did not help. ?She has regular bowel movements, typically once a day.  Her stool consistency varies, but she denies consistent problems with constipation or diarrhea.  No blood in the stool.  Her weight is stable. ? ?An H. Pylori breath test was positive in February and she was treated with triple therapy for two weeks.  She completed the treatment about a month ago but has not noticed a  difference in her pain. ? ? ?Past Medical History:  ?Diagnosis Date  ? Allergy 2012  ? Anxiety   ? Depression   ? Hypertension 2021  ? ? ? ?History reviewed. No pertinent surgical history. ?Family History  ?Problem Relation Age of Onset  ? Anxiety disorder Mother   ? Depression Mother   ? Drug abuse Mother   ? Diabetes Maternal Grandmother   ? Hypertension Maternal Grandmother   ? Kidney disease Maternal Grandmother   ? Obesity Maternal Grandmother   ? Depression Maternal Aunt   ? Hypertension Maternal Aunt   ? Obesity Maternal Aunt   ? Depression Maternal Uncle   ? ?Social History  ? ?Tobacco Use  ? Smoking status: Never  ?Vaping Use  ? Vaping Use: Never used  ?Substance Use Topics  ? Alcohol use: Not Currently  ? Drug use: Yes  ?  Types: Marijuana  ? ?Current Outpatient Medications  ?Medication Sig Dispense Refill  ? azelastine (ASTELIN) 0.1 % nasal spray Place 1 spray into both nostrils 2 (two) times daily. Use in each nostril as directed 30 mL 5  ? omeprazole (PRILOSEC) 20 MG capsule Take 1 capsule (20 mg total) by mouth 2 (two) times daily before a meal. 14 capsule 0  ? ?No current facility-administered medications for this visit.  ? ?Allergies  ?Allergen Reactions  ? Pollen Extract Cough, Itching and Shortness  Of Breath  ? Mixed Ragweed Cough and Itching  ? ? ? ?Review of Systems: ?All systems reviewed and negative except where noted in HPI.  ? ? ?No results found. ? ?Physical Exam: ?BP 122/74   Pulse 88   Ht 5\' 4"  (1.626 m)   Wt 199 lb 3.2 oz (90.4 kg)   BMI 34.19 kg/m?  ?Constitutional: Pleasant,well-developed, African American female in no acute distress. ?HEENT: Normocephalic and atraumatic. Conjunctivae are normal. No scleral icterus. ?Neck supple.  ?Cardiovascular: Normal rate, regular rhythm.  ?Pulmonary/chest: Effort normal and breath sounds normal. No wheezing, rales or rhonchi. ?Abdominal: Soft, nondistended, tenderness to light palpation throughout the abdomen, without rigidity or guarding.  Bowel sounds active throughout. There are no masses palpable. No hepatomegaly. ?Extremities: no edema ?Neurological: Alert and oriented to person place and time. ?Skin: Skin is warm and dry. No rashes noted. ?Psychiatric: Normal mood and affect. Behavior is normal. ? ?CBC ?   ?Component Value Date/Time  ? WBC 5.2 10/14/2019 1640  ? RBC 4.99 10/14/2019 1640  ? HGB 12.5 10/14/2019 1640  ? HCT 39.6 10/14/2019 1640  ? PLT 382 10/14/2019 1640  ? MCV 79.4 (L) 10/14/2019 1640  ? MCH 25.1 (L) 10/14/2019 1640  ? MCHC 31.6 10/14/2019 1640  ? RDW 13.5 10/14/2019 1640  ? ? ?CMP  ?   ?Component Value Date/Time  ? NA 141 10/14/2019 1640  ? K 3.6 10/14/2019 1640  ? CL 106 10/14/2019 1640  ? CO2 26 10/14/2019 1640  ? GLUCOSE 120 (H) 10/14/2019 1640  ? BUN 6 10/14/2019 1640  ? CREATININE 1.00 10/14/2019 1640  ? CALCIUM 9.2 10/14/2019 1640  ? PROT 7.6 10/14/2019 1640  ? ALBUMIN 3.7 10/14/2019 1640  ? AST 18 10/14/2019 1640  ? ALT 17 10/14/2019 1640  ? ALKPHOS 65 10/14/2019 1640  ? BILITOT 0.6 10/14/2019 1640  ? GFRNONAA >60 10/14/2019 1640  ? GFRAA >60 10/14/2019 1640  ? ?Component Ref Range & Units 1 mo ago  ?H. pylori Breath Test NOT DETECTED DETECTED Abnormal   ? ?CLINICAL DATA:  Abdominal pain.  Intermittent lipase elevation ?  ?EXAM: ?CT ABDOMEN AND PELVIS WITH CONTRAST ?  ?TECHNIQUE: ?Multidetector CT imaging of the abdomen and pelvis was performed ?using the standard protocol following bolus administration of ?intravenous contrast. ?  ?CONTRAST:  10/16/2019 OMNIPAQUE IOHEXOL 350 MG/ML SOLN ?  ?COMPARISON:  None. ?  ?FINDINGS: ?Lower chest: Lung bases are clear. ?  ?Hepatobiliary: No focal liver lesions are apparent. Gallbladder wall ?is not appreciably thickened. There is no biliary duct dilatation. ?  ?Pancreas: There is no pancreatic mass or inflammatory focus. No ?peripancreatic fluid or duct dilatation. ?  ?Spleen: No splenic lesions evident. ?  ?Adrenals/Urinary Tract: Adrenals bilaterally appear normal. Kidneys ?bilaterally  show no evident mass or hydronephrosis on either side. ?No renal or ureteral calculi are evident on either side. Urinary ?bladder is midline with wall thickness within normal limits. ?  ?Stomach/Bowel: No appreciable bowel wall or mesenteric thickening. ?Terminal ileum appears normal. There is fatty infiltration in the ?ileocecal valve. No evident bowel obstruction. There is no ?appreciable free air or portal venous air. ?  ?Vascular/Lymphatic: No abdominal aortic aneurysm. No arterial ?vascular lesions are demonstrable. Major venous structures appear ?patent. There is no evident adenopathy in the abdomen or pelvis by ?size criteria. There are subcentimeter lymph nodes in the mid right ?abdomen, considered nonspecific. Subcentimeter inguinal lymph nodes ?also noted, considered nonspecific. ?  ?Reproductive: The uterus is anteverted. There is a probable  dominant ?follicle in the right ovary measuring 1.2 x 1.2 cm. Beyond this ?apparent follicle, there is no pelvic mass. ?  ?Other: The appendix appears normal. No abscess or ascites is evident ?in the abdomen or pelvis. Slight fat noted in the umbilicus. ?  ?Musculoskeletal: No blastic or lytic bone lesions. No intramuscular ?or abdominal wall lesions are evident. ?  ?IMPRESSION: ?1. Subcentimeter mesenteric lymph nodes, considered nonspecific. In ?the appropriate clinical setting, this finding potentially could ?indicate a degree of mesenteric adenitis. Note that by size criteria ?there is no adenopathy in the abdomen or pelvis. ?  ?2. No bowel wall thickening or bowel obstruction. Appendix appears ?normal. No abscess evident in the abdomen or pelvis. ?  ?3. No renal or ureteral calculus. No hydronephrosis on either side. ?Urinary bladder wall thickness normal. ?  ?  ?Electronically Signed ?  By: Bretta BangWilliam  Woodruff III M.D. ?  On: 10/15/2019 10:51 ? ?ASSESSMENT AND PLAN: ?31 year old female with chronic abdominal pain, worsened with menses, without significant  derangement in bowel habits.  She has a reported history of a transiently elevated lipase (not in EMR) but normal appearing pancreas on CT, and lipase normalized that same day.  Suspect that this was lab error. ?She was foun

## 2021-04-18 LAB — TISSUE TRANSGLUTAMINASE, IGA: (tTG) Ab, IgA: <1 U/mL

## 2021-04-18 LAB — IGA: Immunoglobulin A: 199 mg/dL (ref 47–310)

## 2021-04-19 NOTE — Progress Notes (Signed)
Voncia,  ?Your lipase (pancreas enzyme) and celiac test were both normal.  Will see you for your upper endoscopy next month.

## 2021-05-05 ENCOUNTER — Ambulatory Visit (INDEPENDENT_AMBULATORY_CARE_PROVIDER_SITE_OTHER): Payer: Managed Care, Other (non HMO) | Admitting: Nurse Practitioner

## 2021-05-05 ENCOUNTER — Encounter: Payer: Self-pay | Admitting: Nurse Practitioner

## 2021-05-05 VITALS — BP 102/60 | HR 72 | Temp 97.9°F | Ht 64.0 in | Wt 192.6 lb

## 2021-05-05 DIAGNOSIS — E282 Polycystic ovarian syndrome: Secondary | ICD-10-CM | POA: Diagnosis not present

## 2021-05-05 DIAGNOSIS — Z13220 Encounter for screening for lipoid disorders: Secondary | ICD-10-CM | POA: Diagnosis not present

## 2021-05-05 DIAGNOSIS — R109 Unspecified abdominal pain: Secondary | ICD-10-CM | POA: Diagnosis not present

## 2021-05-05 DIAGNOSIS — G8929 Other chronic pain: Secondary | ICD-10-CM

## 2021-05-05 DIAGNOSIS — Z136 Encounter for screening for cardiovascular disorders: Secondary | ICD-10-CM | POA: Diagnosis not present

## 2021-05-05 DIAGNOSIS — Z0001 Encounter for general adult medical examination with abnormal findings: Secondary | ICD-10-CM | POA: Diagnosis not present

## 2021-05-05 LAB — COMPREHENSIVE METABOLIC PANEL
ALT: 11 U/L (ref 0–35)
AST: 17 U/L (ref 0–37)
Albumin: 4.1 g/dL (ref 3.5–5.2)
Alkaline Phosphatase: 56 U/L (ref 39–117)
BUN: 11 mg/dL (ref 6–23)
CO2: 27 mEq/L (ref 19–32)
Calcium: 9.1 mg/dL (ref 8.4–10.5)
Chloride: 105 mEq/L (ref 96–112)
Creatinine, Ser: 1 mg/dL (ref 0.40–1.20)
GFR: 74.6 mL/min (ref >60.00)
Glucose, Bld: 90 mg/dL (ref 70–99)
Potassium: 3.7 mEq/L (ref 3.5–5.1)
Sodium: 138 mEq/L (ref 135–145)
Total Bilirubin: 0.4 mg/dL (ref 0.2–1.2)
Total Protein: 7 g/dL (ref 6.0–8.3)

## 2021-05-05 LAB — CBC
HCT: 36.6 % (ref 36.0–46.0)
Hemoglobin: 12.1 g/dL (ref 12.0–15.0)
MCHC: 33 g/dL (ref 30.0–36.0)
MCV: 77.3 fl — ABNORMAL LOW (ref 78.0–100.0)
Platelets: 303 10*3/uL (ref 150.0–400.0)
RBC: 4.73 Mil/uL (ref 3.87–5.11)
RDW: 13.5 % (ref 11.5–15.5)
WBC: 5.2 10*3/uL (ref 4.0–10.5)

## 2021-05-05 LAB — LIPID PANEL
Cholesterol: 124 mg/dL (ref 0–200)
HDL: 33.5 mg/dL — ABNORMAL LOW (ref >39.00)
LDL Cholesterol: 74 mg/dL (ref 0–99)
NonHDL: 90.17
Total CHOL/HDL Ratio: 4
Triglycerides: 83 mg/dL (ref 0.0–149.0)
VLDL: 16.6 mg/dL (ref 0.0–40.0)

## 2021-05-05 LAB — HEMOGLOBIN A1C: Hgb A1c MFr Bld: 6 % (ref 4.6–6.5)

## 2021-05-05 LAB — TSH: TSH: 0.58 u[IU]/mL (ref 0.35–5.50)

## 2021-05-05 MED ORDER — OMEPRAZOLE 20 MG PO CPDR: 20.0000 mg | DELAYED_RELEASE_CAPSULE | Freq: Two times a day (BID) | ORAL | 5 refills | Status: DC

## 2021-05-05 NOTE — Progress Notes (Signed)
? ?Complete physical exam ? ?Patient: Dorothy Osborne   DOB: [birth year]   30 y.o. Female  MRN: [id number] ?Visit Date: 05/05/2021 ? ?Subjective:  ?  ?Chief Complaint  ?Patient presents with  ? Annual Exam  ?  Physical-No breast or pap exam needed, pt has GYN ?Last PAP completed on 02/25/21, letter sent to GYN for records.  ?Pt is fasting.  ?Pt has endoscopy scheduled for 05/21/2021  ? ? ?Dorothy Osborne is a 31 y.o. female who presents today for a complete physical exam. She reports consuming a general diet. Gym/ health club routine includes cardio and high impact aerobics . She generally feels well. She reports sleeping well. She does not have additional problems to discuss today.  ?Vision:Yes ?Dental:Yes ?STD Screen:No ? ?Most recent fall risk assessment: ?   ? View : No data to display.  ?  ?  ?  ? ?Most recent depression screenings: ? ?  03/10/2021  ?  3:51 PM  ?PHQ 2/9 Scores  ?PHQ - 2 Score 2  ?PHQ- 9 Score 12  ? ? ?HPI  ?No problem-specific Assessment & Plan notes found for this encounter. ? ? ?Past Medical History:  ?Diagnosis Date  ? Allergy 2012  ? Anxiety   ? Depression   ? Hypertension 2021  ? ?Past Surgical History:  ?Procedure Laterality Date  ? LAPAROSCOPY  10/2020  ? ?Social History  ? ?Socioeconomic History  ? Marital status: Single  ?  Spouse name: Not on file  ? Number of children: Not on file  ? Years of education: Not on file  ? Highest education level: Not on file  ?Occupational History  ? Not on file  ?Tobacco Use  ? Smoking status: Never  ? Smokeless tobacco: Not on file  ?Vaping Use  ? Vaping Use: Never used  ?Substance and Sexual Activity  ? Alcohol use: Not Currently  ? Drug use: Yes  ?  Types: Marijuana  ? Sexual activity: Not Currently  ?  Birth control/protection: None  ?Other Topics Concern  ? Not on file  ?Social History Narrative  ? Not on file  ? ?Social Determinants of Health  ? ?Financial Resource Strain: Not on file  ?Food Insecurity: Not on file  ?Transportation Needs: Not on file   ?Physical Activity: Not on file  ?Stress: Not on file  ?Social Connections: Not on file  ?Intimate Partner Violence: Not on file  ? ?Family Status  ?Relation Name Status  ? Mother Alvis Lemmings (Not Specified)  ? [employer] Rosemary (Not Specified)  ? Mat Aunt Tam (Not Specified)  ? Mat [employer] (Not Specified)  ? ?Family History  ?Problem Relation Age of Onset  ? Anxiety disorder Mother   ? Depression Mother   ? Drug abuse Mother   ? Diabetes Maternal Grandmother   ? Hypertension Maternal Grandmother   ? Kidney disease Maternal Grandmother   ? Obesity Maternal Grandmother   ? Depression Maternal Aunt   ? Hypertension Maternal Aunt   ? Obesity Maternal Aunt   ? Depression Maternal Uncle   ? ?Allergies  ?Allergen Reactions  ? Pollen Extract Cough, Itching and Shortness Of Breath  ? Mixed Ragweed Cough and Itching  ?  ?Patient Care Team: ?Jenipher Havel, Bonna Gains, NP as PCP - General (Internal Medicine)  ? ?Medications: ?Outpatient Medications Prior to Visit  ?Medication Sig  ? azelastine (ASTELIN) 0.1 % nasal spray Place 1 spray into both nostrils 2 (two) times daily. Use in each nostril as directed  ? [DISCONTINUED]  omeprazole (PRILOSEC) 20 MG capsule Take 1 capsule (20 mg total) by mouth 2 (two) times daily before a meal.  ? ?No facility-administered medications prior to visit.  ? ? ?Review of Systems  ?Constitutional:  Negative for fever.  ?HENT:  Negative for congestion and sore throat.   ?Eyes:   ?     Negative for visual changes  ?Respiratory:  Negative for cough and shortness of breath.   ?Cardiovascular:  Negative for chest pain, palpitations and leg swelling.  ?Gastrointestinal:  Negative for blood in stool, constipation and diarrhea.  ?Genitourinary:  Negative for dysuria, frequency and urgency.  ?Musculoskeletal:  Negative for myalgias.  ?Skin:  Negative for rash.  ?Neurological:  Negative for dizziness and headaches.  ?Hematological:  Does not bruise/bleed easily.  ?Psychiatric/Behavioral:  Negative for suicidal ideas.  The patient is not nervous/anxious.   ? ? ?   ?Objective:  ?BP 102/60 (BP Location: Left Arm, Patient Position: Sitting, Cuff Size: Normal)   Pulse 72   Temp 97.9 ?F (36.6 ?C) (Temporal)   Ht 5\' 4"  (1.626 m)   Wt 192 lb 9.6 oz (87.4 kg)   SpO2 99%   BMI 33.06 kg/m?  ?  ? ? ? ?Physical Exam ?Vitals reviewed.  ?Constitutional:   ?   General: She is not in acute distress. ?   Appearance: She is well-developed. She is obese.  ?HENT:  ?   Right Ear: Tympanic membrane, ear canal and external ear normal.  ?   Left Ear: Tympanic membrane, ear canal and external ear normal.  ?Eyes:  ?   Extraocular Movements: Extraocular movements intact.  ?   Conjunctiva/sclera: Conjunctivae normal.  ?Cardiovascular:  ?   Rate and Rhythm: Normal rate and regular rhythm.  ?   Pulses: Normal pulses.  ?   Heart sounds: Normal heart sounds.  ?Pulmonary:  ?   Effort: Pulmonary effort is normal. No respiratory distress.  ?   Breath sounds: Normal breath sounds.  ?Chest:  ?   Chest wall: No tenderness.  ?Abdominal:  ?   General: Bowel sounds are normal.  ?   Palpations: Abdomen is soft.  ?Genitourinary: ?   Comments: Deferred breast and pelvic exam to GYN ?Musculoskeletal:     ?   General: Normal range of motion.  ?   Cervical back: Normal range of motion and neck supple.  ?   Right lower leg: No edema.  ?   Left lower leg: No edema.  ?Skin: ?   General: Skin is warm and dry.  ?Neurological:  ?   Mental Status: She is alert and oriented to person, place, and time.  ?   Deep Tendon Reflexes: Reflexes are normal and symmetric.  ?Psychiatric:     ?   Mood and Affect: Mood normal.     ?   Behavior: Behavior normal.     ?   Thought Content: Thought content normal.  ?  ?No results found for any visits on 05/05/21. ?   ?Assessment & Plan:  ?  ?Routine Health Maintenance and Physical Exam ? ?Immunization History  ?Administered Date(s) Administered  ? Tdap 10/17/2020  ? ?Health Maintenance  ?Topic Date Due  ? Hepatitis C Screening  05/06/2022  (Originally 12/09/2006)  ? HIV Screening  05/06/2022 (Originally 12/09/2003)  ? INFLUENZA VACCINE  08/17/2021  ? PAP SMEAR-Modifier  02/26/2024  ? TETANUS/TDAP  10/18/2030  ? HPV VACCINES  Aged Out  ? COVID-19 Vaccine  Discontinued  ? ?Discussed health benefits of  physical activity, and encouraged her to engage in regular exercise appropriate for her age and condition. ? ?Problem List Items Addressed This Visit   ? ?  ? Endocrine  ? PCOS (polycystic ovarian syndrome)  ? Relevant Orders  ? Hemoglobin A1c  ?  ? Other  ? Chronic abdominal pain  ? Relevant Medications  ? omeprazole (PRILOSEC) 20 MG capsule  ? ?Other Visit Diagnoses   ? ? Encounter for preventative adult health care exam with abnormal findings    -  Primary  ? Relevant Orders  ? CBC  ? Comprehensive metabolic panel  ? Lipid panel  ? TSH  ? Encounter for lipid screening for cardiovascular disease      ? Relevant Orders  ? Lipid panel  ? ?  ? ?Return in about 1 year (around 05/06/2022) for CPE (fasting). ? ?  ? ?Alysia Penna, NP ?

## 2021-05-05 NOTE — Patient Instructions (Addendum)
Go to lab for blood draw.  Preventive Care 33-33 Years Old, Female Preventive care refers to lifestyle choices and visits with your health care provider that can promote health and wellness. Preventive care visits are also called wellness exams. What can I expect for my preventive care visit? Counseling During your preventive care visit, your health care provider may ask about your: Medical history, including: Past medical problems. Family medical history. Pregnancy history. Current health, including: Menstrual cycle. Method of birth control. Emotional well-being. Home life and relationship well-being. Sexual activity and sexual health. Lifestyle, including: Alcohol, nicotine or tobacco, and drug use. Access to firearms. Diet, exercise, and sleep habits. Work and work environment. Sunscreen use. Safety issues such as seatbelt and bike helmet use. Physical exam Your health care provider may check your: Height and weight. These may be used to calculate your BMI (body mass index). BMI is a measurement that tells if you are at a healthy weight. Waist circumference. This measures the distance around your waistline. This measurement also tells if you are at a healthy weight and may help predict your risk of certain diseases, such as type 2 diabetes and high blood pressure. Heart rate and blood pressure. Body temperature. Skin for abnormal spots. What immunizations do I need?  Vaccines are usually given at various ages, according to a schedule. Your health care provider will recommend vaccines for you based on your age, medical history, and lifestyle or other factors, such as travel or where you work. What tests do I need? Screening Your health care provider may recommend screening tests for certain conditions. This may include: Pelvic exam and Pap test. Lipid and cholesterol levels. Diabetes screening. This is done by checking your blood sugar (glucose) after you have not eaten for a  while (fasting). Hepatitis B test. Hepatitis C test. HIV (human immunodeficiency virus) test. STI (sexually transmitted infection) testing, if you are at risk. BRCA-related cancer screening. This may be done if you have a family history of breast, ovarian, tubal, or peritoneal cancers. Talk with your health care provider about your test results, treatment options, and if necessary, the need for more tests. Follow these instructions at home: Eating and drinking  Eat a healthy diet that includes fresh fruits and vegetables, whole grains, lean protein, and low-fat dairy products. Take vitamin and mineral supplements as recommended by your health care provider. Do not drink alcohol if: Your health care provider tells you not to drink. You are pregnant, may be pregnant, or are planning to become pregnant. If you drink alcohol: Limit how much you have to 0-1 drink a day. Know how much alcohol is in your drink. In the [place] one drink equals one 12 oz bottle of beer (355 mL), one 5 oz glass of wine (148 mL), or one 1 oz glass of hard liquor (44 mL). Lifestyle Brush your teeth every morning and night with fluoride toothpaste. Floss one time each day. Exercise for at least 30 minutes 5 or more days each week. Do not use any products that contain nicotine or tobacco. These products include cigarettes, chewing tobacco, and vaping devices, such as e-cigarettes. If you need help quitting, ask your health care provider. Do not use drugs. If you are sexually active, practice safe sex. Use a condom or other form of protection to prevent STIs. If you do not wish to become pregnant, use a form of birth control. If you plan to become pregnant, see your health care provider for a prepregnancy visit. Find healthy   ways to manage stress, such as: ?Meditation, yoga, or listening to music. ?Journaling. ?Talking to a trusted person. ?Spending time with friends and family. ?Minimize exposure to UV radiation to reduce  your risk of skin cancer. ?Safety ?Always wear your seat belt while driving or riding in a vehicle. ?Do not drive: ?If you have been drinking alcohol. Do not ride with someone who has been drinking. ?If you have been using any mind-altering substances or drugs. ?While texting. ?When you are tired or distracted. ?Wear a helmet and other protective equipment during sports activities. ?If you have firearms in your house, make sure you follow all gun safety procedures. ?Seek help if you have been physically or sexually abused. ?What's next? ?Go to your health care provider once a year for an annual wellness visit. ?Ask your health care provider how often you should have your eyes and teeth checked. ?Stay up to date on all vaccines. ?This information is not intended to replace advice given to you by your health care provider. Make sure you discuss any questions you have with your health care provider. ?Document Revised: 07/01/2020 Document Reviewed: 07/01/2020 ?Elsevier Patient Education ? [place]. ? ?

## 2021-05-19 ENCOUNTER — Ambulatory Visit (AMBULATORY_SURGERY_CENTER): Payer: Managed Care, Other (non HMO) | Admitting: Gastroenterology

## 2021-05-19 ENCOUNTER — Encounter: Payer: Self-pay | Admitting: Gastroenterology

## 2021-05-19 VITALS — BP 120/78 | HR 84 | Temp 98.3°F | Resp 17 | Ht 64.0 in | Wt 192.0 lb

## 2021-05-19 DIAGNOSIS — K2950 Unspecified chronic gastritis without bleeding: Secondary | ICD-10-CM

## 2021-05-19 DIAGNOSIS — R1084 Generalized abdominal pain: Secondary | ICD-10-CM

## 2021-05-19 DIAGNOSIS — K2970 Gastritis, unspecified, without bleeding: Secondary | ICD-10-CM | POA: Diagnosis not present

## 2021-05-19 MED ORDER — SODIUM CHLORIDE 0.9 % IV SOLN: 500.0000 mL | Freq: Once | INTRAVENOUS | Status: DC

## 2021-05-19 NOTE — Patient Instructions (Signed)
Discharge instructions given. ?Biopsies taken. ?Resume previous medications. ?YOU HAD AN ENDOSCOPIC PROCEDURE TODAY AT THE [facility]:   Refer to the procedure report that was given to you for any specific questions about what was found during the examination.  If the procedure report does not answer your questions, please call your gastroenterologist to clarify.  If you requested that your care partner not be given the details of your procedure findings, then the procedure report has been included in a sealed envelope for you to review at your convenience later. ? ?YOU SHOULD EXPECT: Some feelings of bloating in the abdomen. Passage of more gas than usual.  Walking can help get rid of the air that was put into your GI tract during the procedure and reduce the bloating. If you had a lower endoscopy (such as a colonoscopy or flexible sigmoidoscopy) you may notice spotting of blood in your stool or on the toilet paper. If you underwent a bowel prep for your procedure, you may not have a normal bowel movement for a few days. ? ?Please Note:  You might notice some irritation and congestion in your nose or some drainage.  This is from the oxygen used during your procedure.  There is no need for concern and it should clear up in a day or so. ? ?SYMPTOMS TO REPORT IMMEDIATELY: ? ? ?Following upper endoscopy (EGD) ? Vomiting of blood or coffee ground material ? New chest pain or pain under the shoulder blades ? Painful or persistently difficult swallowing ? New shortness of breath ? Fever of 100?F or higher ? Black, tarry-looking stools ? ?For urgent or emergent issues, a gastroenterologist can be reached at any hour by calling [phone]. ?Do not use MyChart messaging for urgent concerns.  ? ? ?DIET:  We do recommend a small meal at first, but then you may proceed to your regular diet.  Drink plenty of fluids but you should avoid alcoholic beverages for 24 hours. ? ?ACTIVITY:  You should plan to take it  easy for the rest of today and you should NOT DRIVE or use heavy machinery until tomorrow (because of the sedation medicines used during the test).   ? ?FOLLOW UP: ?Our staff will call the number listed on your records 48-72 hours following your procedure to check on you and address any questions or concerns that you may have regarding the information given to you following your procedure. If we do not reach you, we will leave a message.  We will attempt to reach you two times.  During this call, we will ask if you have developed any symptoms of COVID 19. If you develop any symptoms (ie: fever, flu-like symptoms, shortness of breath, cough etc.) before then, please call [phone].  If you test positive for Covid 19 in the 2 weeks post procedure, please call and report this information to us.   ? ?If any biopsies were taken you will be contacted by phone or by letter within the next 1-3 weeks.  Please call us at [phone] if you have not heard about the biopsies in 3 weeks.  ? ? ?SIGNATURES/CONFIDENTIALITY: ?You and/or your care partner have signed paperwork which will be entered into your electronic medical record.  These signatures attest to the fact that that the information above on your After Visit Summary has been reviewed and is understood.  Full responsibility of the confidentiality of this discharge information lies with you and/or your care-partner.  ?

## 2021-05-19 NOTE — Op Note (Addendum)
[facility] ?Patient Name: Dorothy Osborne ?Procedure Date: 05/19/2021 9:35 AM ?MRN: [id number] ?Endoscopist: Yaslin Kirtley E. Tomasa Rand , MD ?Age: 31 ?Referring MD:  ?Date of Birth: 1990-10-17 ?Gender: Female ?Account #: [id number] ?Procedure:                Upper GI endoscopy ?Indications:              Generalized abdominal pain, Previously treated for  ?                          Helicobacter pylori ?Medicines:                Monitored Anesthesia Care ?Procedure:                Pre-Anesthesia Assessment: ?                          - Prior to the procedure, a History and Physical  ?                          was performed, and patient medications and  ?                          allergies were reviewed. The patient's tolerance of  ?                          previous anesthesia was also reviewed. The risks  ?                          and benefits of the procedure and the sedation  ?                          options and risks were discussed with the patient.  ?                          All questions were answered, and informed consent  ?                          was obtained. Prior Anticoagulants: The patient has  ?                          taken no previous anticoagulant or antiplatelet  ?                          agents. ASA Grade Assessment: II - A patient with  ?                          mild systemic disease. After reviewing the risks  ?                          and benefits, the patient was deemed in  ?                          satisfactory condition to undergo the procedure. ?  After obtaining informed consent, the endoscope was  ?                          passed under direct vision. Throughout the  ?                          procedure, the patient's blood pressure, pulse, and  ?                          oxygen saturations were monitored continuously. The  ?                          GIF [id number] #[id number] was introduced through the  ?                          mouth, and advanced to the  third part of duodenum.  ?                          The upper GI endoscopy was accomplished without  ?                          difficulty. The patient tolerated the procedure  ?                          well. ?Scope In: ?Scope Out: ?Findings:                 The examined portions of the nasopharynx,  ?                          oropharynx and larynx were normal. ?                          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 ?                          Islands of salmon-colored mucosa were present at 36  ?                          cm. No other visible abnormalities were present.  ?                          Biopsies were taken with a cold forceps for  ?                          histology. Estimated blood loss was minimal. ?                          The exam of the esophagus was otherwise normal. ?                          The gastroesophageal flap valve  was visualized  ?                          endoscopically and classified as Hill Grade III  ?                          (minimal fold, loose to endoscope, hiatal hernia  ?                          likely). ?                          The entire examined stomach was normal. Biopsies  ?                          were taken with a cold forceps for Helicobacter  ?                          pylori testing. Estimated blood loss was minimal. ?                          The examined duodenum was normal. ?Complications:            No immediate complications. ?Estimated Blood Loss:     Estimated blood loss was minimal. ?Impression:               - The examined portions of the nasopharynx,  ?                          oropharynx and larynx were normal. ?                          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 ?                          - Salmon-colored islands of mucosa. Biopsied. ?                          - Gastroesophageal flap valve classified  as Hill  ?                          Grade III (minimal fold, loose to endoscope, hiatal  ?                          hernia likely). ?                          - Normal stomach. Biopsied. ?                          - Normal examined duodenum. ?Recommendation:           - Patient has a contact number available for  ?  emergencies. The signs and symptoms of potential  ?                          delayed complications were discussed with the  ?                          patient. Return to normal activities tomorrow.  ?                          Written discharge instructions were provided to the  ?                          patient. ?                          - Resume previous diet. ?                          - Continue present medications. ?                          - Await pathology results. ?Korrina Zern E. Tomasa Randunningham, MD ?05/19/2021 9:55:05 AM ?This report has been signed electronically. ?

## 2021-05-19 NOTE — Progress Notes (Signed)
Report to PACU, RN, vss, BBS= Clear.  

## 2021-05-19 NOTE — Progress Notes (Signed)
[facility] History and Physical ? ? ?Primary Care Physician:  Anne Ng, NP ? ? ?Reason for Procedure:   Abdominal pain, history of H. pylori ? ?Plan:    EGD with gastric biopsies ? ? ? ? ?HPI: Dorothy Osborne is a 31 y.o. female with chronic abdominal pain and history of positive H. Pylori breath test, treated.  EGD to assess for peptic ulcer disease and presence of H. Pylori. ? ? ?Past Medical History:  ?Diagnosis Date  ? Allergy 2012  ? Anxiety   ? Depression   ? Hypertension 2021  ? ? ?Past Surgical History:  ?Procedure Laterality Date  ? LAPAROSCOPY  10/2020  ? UPPER GASTROINTESTINAL ENDOSCOPY    ? ? ?Prior to Admission medications   ?Medication Sig Start Date End Date Taking? Authorizing Provider  ?azelastine (ASTELIN) 0.1 % nasal spray Place 1 spray into both nostrils 2 (two) times daily. Use in each nostril as directed 03/30/21  Yes Nche, Bonna Gains, NP  ?omeprazole (PRILOSEC) 20 MG capsule Take 1 capsule (20 mg total) by mouth 2 (two) times daily before a meal. ?Patient not taking: Reported on 05/19/2021 05/05/21   Nche, Bonna Gains, NP  ? ? ?Current Outpatient Medications  ?Medication Sig Dispense Refill  ? azelastine (ASTELIN) 0.1 % nasal spray Place 1 spray into both nostrils 2 (two) times daily. Use in each nostril as directed 30 mL 5  ? omeprazole (PRILOSEC) 20 MG capsule Take 1 capsule (20 mg total) by mouth 2 (two) times daily before a meal. (Patient not taking: Reported on 05/19/2021) 60 capsule 5  ? ?Current Facility-Administered Medications  ?Medication Dose Route Frequency Provider Last Rate Last Admin  ? 0.9 %  sodium chloride infusion  500 mL Intravenous Once Jenel Lucks, MD      ? ? ?Allergies as of 05/19/2021 - Review Complete 05/19/2021  ?Allergen Reaction Noted  ? Pollen extract Cough, Itching, and Shortness Of Breath 03/10/2021  ? Mixed ragweed Cough and Itching 03/10/2021  ? ? ?Family History  ?Problem Relation Age of Onset  ? Anxiety disorder Mother   ?  Depression Mother   ? Drug abuse Mother   ? Depression Maternal Aunt   ? Hypertension Maternal Aunt   ? Obesity Maternal Aunt   ? Depression Maternal Uncle   ? Diabetes Maternal Grandmother   ? Hypertension Maternal Grandmother   ? Kidney disease Maternal Grandmother   ? Obesity Maternal Grandmother   ? Colon cancer Neg Hx   ? Esophageal cancer Neg Hx   ? Rectal cancer Neg Hx   ? Stomach cancer Neg Hx   ? ? ?Social History  ? ?Socioeconomic History  ? Marital status: Single  ?  Spouse name: Not on file  ? Number of children: Not on file  ? Years of education: Not on file  ? Highest education level: Not on file  ?Occupational History  ? Not on file  ?Tobacco Use  ? Smoking status: Never  ? Smokeless tobacco: Never  ?Vaping Use  ? Vaping Use: Never used  ?Substance and Sexual Activity  ? Alcohol use: Yes  ?  Comment: 1-2 times per month  ? Drug use: Yes  ?  Types: Marijuana  ? Sexual activity: Not Currently  ?  Birth control/protection: None  ?Other Topics Concern  ? Not on file  ?Social History Narrative  ? Not on file  ? ?Social Determinants of Health  ? ?Financial Resource Strain: Not on file  ?Food Insecurity: Not  on file  ?Transportation Needs: Not on file  ?Physical Activity: Not on file  ?Stress: Not on file  ?Social Connections: Not on file  ?Intimate Partner Violence: Not on file  ? ? ?Review of Systems: ? ?All other review of systems negative except as mentioned in the HPI. ? ?Physical Exam: ?Vital signs ?BP (!) 145/48   Pulse 83   Temp 98.3 ?F (36.8 ?C) (Temporal)   Ht 5\' 4"  (1.626 m)   Wt 192 lb (87.1 kg)   LMP 04/19/2021 (Approximate)   SpO2 100%   BMI 32.96 kg/m?  ? ?General:   Alert,  Well-developed, well-nourished, pleasant and cooperative in NAD ?Airway:  Mallampati 2 ?Lungs:  Clear throughout to auscultation.   ?Heart:  Regular rate and rhythm; no murmurs, clicks, rubs,  or gallops. ?Abdomen:  Soft, nontender and nondistended. Normal bowel sounds.   ?Neuro/Psych:  Normal mood and affect. A and  O x 3 ? ? ?Maat Kafer E. 06/19/2021, MD ?[facility] ? ?

## 2021-05-19 NOTE — Progress Notes (Signed)
Called to room to assist during endoscopic procedure.  Patient ID and intended procedure confirmed with present staff. Received instructions for my participation in the procedure from the performing physician.  

## 2021-05-21 ENCOUNTER — Telehealth: Payer: Self-pay

## 2021-05-21 NOTE — Telephone Encounter (Signed)
?  Follow up Call- ? ? ?  05/19/2021  ?  9:21 AM  ?Call back number  ?Post procedure Call Back phone  # [phone]  ?Permission to leave phone message Yes  ?  ? ?Patient questions: ? ?Do you have a fever, pain , or abdominal swelling? No. ?Pain Score  0 * ? ?Have you tolerated food without any problems? Yes.   ? ?Have you been able to return to your normal activities? Yes.   ? ?Do you have any questions about your discharge instructions: ?Diet   No. ?Medications  No. ?Follow up visit  No. ? ?Do you have questions or concerns about your Care? No. ? ?Actions: ?* If pain score is 4 or above: ?No action needed, pain <4. ? ? ?

## 2021-05-24 NOTE — Progress Notes (Signed)
Ms. Economos,  ?The biopsies taken from your stomach were notable for mild reactive gastropathy which is a common finding and often related to use of certain medications (usually NSAIDs), but there was no evidence of Helicobacter pylori infection. This common finding is not felt to necessarily be a cause of any particular symptom and there is no specific treatment or further evaluation recommended. ?The biopsies near the junction of your esophagus and stomach did not show any evidence of Barrett's esophagus (a precancerous change in the esophagus).  There were mild inflammatory changes most likely related to exposure to stomach acid.   ?No further treatment or surveillance is recommended. ? ?Please follow up with me as needed for ongoing abdominal pain symptoms.

## 2021-06-15 ENCOUNTER — Encounter: Payer: Self-pay | Admitting: Gastroenterology

## 2021-06-15 ENCOUNTER — Ambulatory Visit: Payer: Managed Care, Other (non HMO) | Admitting: Gastroenterology

## 2021-06-15 VITALS — BP 130/80 | HR 70 | Ht 64.0 in | Wt 191.0 lb

## 2021-06-15 DIAGNOSIS — K219 Gastro-esophageal reflux disease without esophagitis: Secondary | ICD-10-CM | POA: Diagnosis not present

## 2021-06-15 DIAGNOSIS — R1084 Generalized abdominal pain: Secondary | ICD-10-CM

## 2021-06-15 NOTE — Progress Notes (Signed)
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  She was also previously diagnosed with H. pylori and treated with triple therapy without improvement in her pain.  She had had a mildly elevated lipase in the past.  At her visit, I recommended she try IBgard, fiber supplementation consider low FODMAP diet.  She underwent an EGD May 3 which was notable for scattered islands of salmon-colored mucosa in the distal esophagus, gastric inlet patch, and a Hill grade 3 valve.  Stomach appeared normal.  Biopsies of the stomach showed reactive gastropathy without evidence of H. pylori.  Biopsies; mucosa in the esophagus showed reactive changes, but no Barrett's mucosa.  Tests for celiac disease and repeat lipase were normal. Today, the patient reports that her pain is much better now.  She still has an occasional bouts of abdominal pain, but they are much less severe and less frequent.  Her pain seemed to get better shortly after her upper endoscopy.  She attributes the improvement in her pain to changes in lifestyle in her diet.  She has been doing the low FODMAP diet and thinks this works well for her.  She eats small frequent meals throughout the day.  A typical day for her would include [place] yogurt with fruit for breakfast, whole-wheat bagel for a snack, baked chicken or salmon with vegetables for lunch, frequently with sweet potatoes and often leftovers for lunch for dinner.  She has been exercising regularly, going to kickboxing class.  Her menstrual cycles are also less painful now. She is still taking Prilosec twice daily.  When she has tried to stop it, she does get a recurrence of burning pain in her epigastrium.   Past Medical History:  Diagnosis Date   Allergy 2012   Anxiety    Depression     Hypertension 2021   EGD May 19, 2021: Gastric inlet patch, scattered islands of salmon-colored mucosa in distal esophagus, Hill grade 3 valve.  Biopsies stomach showed reactive gastropathy, no H. pylori.  Biopsies of esophagus showed reactive changes without evidence of Barrett's  Past Surgical History:  Procedure Laterality Date   LAPAROSCOPY  10/2020   UPPER GASTROINTESTINAL ENDOSCOPY     Family History  Problem Relation Age of Onset   Anxiety disorder Mother    Depression Mother    Drug abuse Mother    Depression Maternal Aunt    Hypertension Maternal Aunt    Obesity Maternal Aunt    Depression Maternal Uncle    Diabetes Maternal Grandmother    Hypertension Maternal Grandmother    Kidney disease Maternal Grandmother    Obesity Maternal Grandmother    Colon cancer Neg Hx    Esophageal cancer Neg Hx    Rectal cancer Neg Hx    Stomach cancer Neg Hx    Social History   Tobacco Use   Smoking status: Never   Smokeless tobacco: Never  Vaping Use   Vaping Use: Never used  Substance Use Topics   Alcohol use: Yes    Comment: 1-2 times per month   Drug use: Yes    Types: Marijuana   Current Outpatient Medications  Medication Sig Dispense Refill   azelastine (ASTELIN) 0.1 % nasal spray Place 1 spray into both nostrils 2 (two) times daily. Use in each nostril as directed  30 mL 5   omeprazole (PRILOSEC) 20 MG capsule Take 1 capsule (20 mg total) by mouth 2 (two) times daily before a meal. (Patient not taking: Reported on 05/19/2021) 60 capsule 5   No current facility-administered medications for this visit.   Allergies  Allergen Reactions   Pollen Extract Cough, Itching and Shortness Of Breath   Mixed Ragweed Cough and Itching     Review of Systems: All systems reviewed and negative except where noted in HPI.    No results found.  Physical Exam: BP 130/80   Pulse 70   Ht 5\' 4"  (1.626 m)   Wt 191 lb (86.6 kg)   SpO2 98%   BMI 32.79 kg/m  Constitutional:  Pleasant,well-developed, African-American female in no acute distress. HEENT: Normocephalic and atraumatic. Conjunctivae are normal. No scleral icterus. Neurological: Alert and oriented to person place and time. Skin: Skin is warm and dry. No rashes noted. Psychiatric: Normal mood and affect. Behavior is normal.  CBC    Component Value Date/Time   WBC 5.2 05/05/2021 1345   RBC 4.73 05/05/2021 1345   HGB 12.1 05/05/2021 1345   HCT 36.6 05/05/2021 1345   PLT 303.0 05/05/2021 1345   MCV 77.3 (L) 05/05/2021 1345   MCH 25.1 (L) 10/14/2019 1640   MCHC 33.0 05/05/2021 1345   RDW 13.5 05/05/2021 1345    CMP     Component Value Date/Time   NA 138 05/05/2021 1345   K 3.7 05/05/2021 1345   CL 105 05/05/2021 1345   CO2 27 05/05/2021 1345   GLUCOSE 90 05/05/2021 1345   BUN 11 05/05/2021 1345   CREATININE 1.00 05/05/2021 1345   CALCIUM 9.1 05/05/2021 1345   PROT 7.0 05/05/2021 1345   ALBUMIN 4.1 05/05/2021 1345   AST 17 05/05/2021 1345   ALT 11 05/05/2021 1345   ALKPHOS 56 05/05/2021 1345   BILITOT 0.4 05/05/2021 1345   GFRNONAA >60 10/14/2019 1640   GFRAA >60 10/14/2019 1640     ASSESSMENT AND PLAN: 31 year old female with 1-1/2 years of chronic abdominal pain, typically related to menses.  The pain used to be very severe on a daily basis.  She also had a history of H. pylori and was treated with triple therapy.  An upper endoscopy after treatment was negative for evidence of H. pylori.  Her pain has improved significantly over the past month, which she attributes to lifestyle changes to include improving her diet and regular exercise.  Her bowel movements continue to be regular.  She still on high-dose PPI, but had no evidence of esophagitis or peptic ulcer disease.  I recommended she decrease this to once daily dosing, and eventually try to wean off completely.  With regards to her abdominal pain, I do not recommend making any changes at this time.  If she has recurrence of her  previous pain, I would recommend she try Bentyl if the pain persists to make a follow-up clinic appointment to discuss other treatments such as TCAs.  Abdominal pain, generalized - Significantly improved with lifestyle changes - No further work-up or treatment changes recommended at this time - If pain returns, recommend trial of Bentyl  GERD - Decrease omeprazole to once daily  Bekim Werntz E. Candis Schatz, MD [facility]  Nche, Charlene Brooke, NP

## 2021-06-15 NOTE — Patient Instructions (Signed)
If you are age 33 or older, your body mass index should be between 23-30. Your Body mass index is 32.79 kg/m. If this is out of the aforementioned range listed, please consider follow up with your Primary Care Provider.  If you are age 15 or younger, your body mass index should be between 19-25. Your Body mass index is 32.79 kg/m. If this is out of the aformentioned range listed, please consider follow up with your Primary Care Provider.   ________________________________________________________  The [facility] providers would like to encourage you to use [facility] to communicate with providers for non-urgent requests or questions.  Due to long hold times on the telephone, sending your provider a message by [facility] may be a faster and more efficient way to get a response.  Please allow 48 business hours for a response.  Please remember that this is for non-urgent requests.  _______________________________________________________  Please follow up as needed

## 2021-08-02 ENCOUNTER — Other Ambulatory Visit: Payer: Self-pay | Admitting: Nurse Practitioner

## 2021-08-02 ENCOUNTER — Other Ambulatory Visit (INDEPENDENT_AMBULATORY_CARE_PROVIDER_SITE_OTHER): Payer: Managed Care, Other (non HMO)

## 2021-08-02 DIAGNOSIS — E786 Lipoprotein deficiency: Secondary | ICD-10-CM

## 2021-08-02 DIAGNOSIS — B9681 Helicobacter pylori [H. pylori] as the cause of diseases classified elsewhere: Secondary | ICD-10-CM

## 2021-08-02 DIAGNOSIS — R7303 Prediabetes: Secondary | ICD-10-CM

## 2021-08-02 LAB — LIPID PANEL
Cholesterol: 126 mg/dL (ref 0–200)
HDL: 32.3 mg/dL — ABNORMAL LOW (ref >39.00)
LDL Cholesterol: 73 mg/dL (ref 0–99)
NonHDL: 93.4
Total CHOL/HDL Ratio: 4
Triglycerides: 102 mg/dL (ref 0.0–149.0)
VLDL: 20.4 mg/dL (ref 0.0–40.0)

## 2021-08-02 LAB — BASIC METABOLIC PANEL
BUN: 10 mg/dL (ref 6–23)
CO2: 25 mEq/L (ref 19–32)
Calcium: 9.2 mg/dL (ref 8.4–10.5)
Chloride: 110 mEq/L (ref 96–112)
Creatinine, Ser: 1.06 mg/dL (ref 0.40–1.20)
GFR: 69.44 mL/min (ref >60.00)
Glucose, Bld: 103 mg/dL — ABNORMAL HIGH (ref 70–99)
Potassium: 4.2 mEq/L (ref 3.5–5.1)
Sodium: 135 mEq/L (ref 135–145)

## 2021-08-02 LAB — HEMOGLOBIN A1C: Hgb A1c MFr Bld: 6 % (ref 4.6–6.5)

## 2021-08-02 NOTE — Progress Notes (Signed)
Repeat labs ordered from last visit.

## 2021-08-06 LAB — HELICOBACTER PYLORI  SPECIAL ANTIGEN
MICRO NUMBER:: 13673214
SPECIMEN QUALITY: ADEQUATE

## 2021-08-11 ENCOUNTER — Encounter: Payer: Self-pay | Admitting: Nurse Practitioner

## 2022-08-04 ENCOUNTER — Other Ambulatory Visit: Payer: Self-pay | Admitting: Nurse Practitioner

## 2022-08-04 DIAGNOSIS — J301 Allergic rhinitis due to pollen: Secondary | ICD-10-CM

## 2022-11-08 ENCOUNTER — Ambulatory Visit: Payer: Managed Care, Other (non HMO) | Admitting: Nurse Practitioner

## 2022-11-08 ENCOUNTER — Other Ambulatory Visit: Payer: Self-pay

## 2022-11-08 ENCOUNTER — Encounter: Payer: Self-pay | Admitting: Nurse Practitioner

## 2022-11-08 VITALS — BP 125/71 | HR 67 | Temp 98.3°F | Resp 18 | Ht 64.0 in | Wt 211.2 lb

## 2022-11-08 DIAGNOSIS — E66812 Obesity, class 2: Secondary | ICD-10-CM

## 2022-11-08 DIAGNOSIS — E282 Polycystic ovarian syndrome: Secondary | ICD-10-CM

## 2022-11-08 DIAGNOSIS — J301 Allergic rhinitis due to pollen: Secondary | ICD-10-CM | POA: Diagnosis not present

## 2022-11-08 DIAGNOSIS — E669 Obesity, unspecified: Secondary | ICD-10-CM | POA: Insufficient documentation

## 2022-11-08 DIAGNOSIS — R7303 Prediabetes: Secondary | ICD-10-CM

## 2022-11-08 DIAGNOSIS — Z6838 Body mass index (BMI) 38.0-38.9, adult: Secondary | ICD-10-CM

## 2022-11-08 MED ORDER — AZELASTINE HCL 0.1 % NA SOLN: 1.0000 | Freq: Two times a day (BID) | NASAL | 5 refills | Status: AC

## 2022-11-08 MED ORDER — WEGOVY 0.25 MG/0.5ML ~~LOC~~ SOAJ: 0.2500 mg | SUBCUTANEOUS | 0 refills | Status: DC

## 2022-11-08 NOTE — Assessment & Plan Note (Signed)
No weight loss noted despite lifestyle modification in last 4months Diet: low carb, low sugar, high protein Exercise: playing tennis and walking 3-4x/week. We discussed use of GLP-1 injection, possible side effects and contraindications. She verbalized understanding and agreed to start med. Wt Readings from Last 3 Encounters:  11/08/22 211 lb 3.2 oz (95.8 kg)  06/15/21 191 lb (86.6 kg)  05/19/21 192 lb (87.1 kg)    Repeat hgbA1c, THYROID, CMP Sent wegovy 0.25mg  weekly F/up in 63month

## 2022-11-08 NOTE — Assessment & Plan Note (Signed)
Azelastine sent

## 2022-11-08 NOTE — Assessment & Plan Note (Addendum)
Per pelvic US 09/2020 by [facility]: Endovaginal sonogram was performed today due to the indications outlined above. The sonogram reveals a normal appearing uterus with characteristics as described above. The endometrium and myometrium appear normal. The adnexae were visualized bilaterally and appear normal. The right and left ovary are enlarged and polycystic, suspect PCOS.   Hx of irregular menstrual cycle but has been regular for last 1year.  We discussed importance heart healthy diet and regular exercise Central obesity present, no acanthosis nigricans, acne on face and chest. Treats acne with OVER THE COUNTER acne product Previous hgbA1c at 6%, Tsh normal in 2023  Repeat testosterone (total and free), hgbA1c, lipid panel and CMP Sent metformin if wegovy is not covered by insurance

## 2022-11-08 NOTE — Progress Notes (Signed)
Established Patient Visit  Patient: Dorothy Osborne   DOB: 05/03/1990   32 y.o. Female  MRN: [id number] Visit Date: 11/08/2022  Subjective:    Chief Complaint  Patient presents with   office visit     PT is here for medication management. RX refills for Azelastine 0.1% is needed.    HPI PCOS (polycystic ovarian syndrome) Per pelvic US 09/2020 by [facility]: Endovaginal sonogram was performed today due to the indications outlined above. The sonogram reveals a normal appearing uterus with characteristics as described above. The endometrium and myometrium appear normal. The adnexae were visualized bilaterally and appear normal. The right and left ovary are enlarged and polycystic, suspect PCOS.   Hx of irregular menstrual cycle but has been regular for last 1year.  We discussed importance heart healthy diet and regular exercise Central obesity present, no acanthosis nigricans, acne on face and chest. Treats acne with OVER THE COUNTER acne product Previous hgbA1c at 6%, Tsh normal in 2023  Repeat testosterone (total and free), hgbA1c, lipid panel and CMP Sent metformin if wegovy is not covered by insurance  Seasonal allergic rhinitis due to pollen Azelastine sent  Class 2 severe obesity due to excess calories with serious comorbidity and body mass index (BMI) of 38.0 to 38.9 in adult (HCC) No weight loss noted despite lifestyle modification in last 4months Diet: low carb, low sugar, high protein Exercise: playing tennis and walking 3-4x/week. We discussed use of GLP-1 injection, possible side effects and contraindications. She verbalized understanding and agreed to start med. Wt Readings from Last 3 Encounters:  11/08/22 211 lb 3.2 oz (95.8 kg)  06/15/21 191 lb (86.6 kg)  05/19/21 192 lb (87.1 kg)    Repeat hgbA1c, THYROID, CMP Sent wegovy 0.25mg  weekly F/up in 21month   Reviewed medical, surgical, and social history today  Medications: Outpatient Medications  Prior to Visit  Medication Sig Note   omeprazole (PRILOSEC) 20 MG capsule Take 1 capsule (20 mg total) by mouth 2 (two) times daily before a meal.    [DISCONTINUED] azelastine (ASTELIN) 0.1 % nasal spray Place 1 spray into both nostrils 2 (two) times daily. Use in each nostril as directed 11/08/2022: Refill are needed.    No facility-administered medications prior to visit.   Reviewed past medical and social history.   ROS per HPI above  Last metabolic panel Lab Results  Component Value Date   GLUCOSE 103 (H) 08/02/2021   NA 135 08/02/2021   K 4.2 08/02/2021   CL 110 08/02/2021   CO2 25 08/02/2021   BUN 10 08/02/2021   CREATININE 1.06 08/02/2021   GFR 69.44 08/02/2021   CALCIUM 9.2 08/02/2021   PROT 7.0 05/05/2021   ALBUMIN 4.1 05/05/2021   BILITOT 0.4 05/05/2021   ALKPHOS 56 05/05/2021   AST 17 05/05/2021   ALT 11 05/05/2021   ANIONGAP 9 10/14/2019   Last lipids Lab Results  Component Value Date   CHOL 126 08/02/2021   HDL 32.30 (L) 08/02/2021   LDLCALC 73 08/02/2021   TRIG 102.0 08/02/2021   CHOLHDL 4 08/02/2021   Last hemoglobin A1c Lab Results  Component Value Date   HGBA1C 6.0 08/02/2021   Last thyroid functions Lab Results  Component Value Date   TSH 0.58 05/05/2021        Objective:  BP 125/71 (BP Location: Right Arm, Patient Position: Sitting, Cuff Size: Large)   Pulse 67   Temp  98.3 F (36.8 C) (Temporal)   Resp 18   Ht 5\' 4"  (1.626 m)   Wt 211 lb 3.2 oz (95.8 kg)   LMP 11/01/2022 (Exact Date)   SpO2 98%   BMI 36.25 kg/m      Physical Exam Vitals and nursing note reviewed.  Neck:     Thyroid: No thyroid mass, thyromegaly or thyroid tenderness.  Cardiovascular:     Rate and Rhythm: Normal rate and regular rhythm.     Pulses: Normal pulses.     Heart sounds: Normal heart sounds.  Pulmonary:     Effort: Pulmonary effort is normal.     Breath sounds: Normal breath sounds.  Musculoskeletal:     Cervical back: Normal range of motion  and neck supple.  Neurological:     Mental Status: She is alert and oriented to person, place, and time.     No results found for any visits on 11/08/22.    Assessment & Plan:    Problem List Items Addressed This Visit     Class 2 severe obesity due to excess calories with serious comorbidity and body mass index (BMI) of 38.0 to 38.9 in adult (HCC)    No weight loss noted despite lifestyle modification in last 4months Diet: low carb, low sugar, high protein Exercise: playing tennis and walking 3-4x/week. We discussed use of GLP-1 injection, possible side effects and contraindications. She verbalized understanding and agreed to start med. Wt Readings from Last 3 Encounters:  11/08/22 211 lb 3.2 oz (95.8 kg)  06/15/21 191 lb (86.6 kg)  05/19/21 192 lb (87.1 kg)    Repeat hgbA1c, THYROID, CMP Sent wegovy 0.25mg  weekly F/up in 64month      Relevant Medications   Semaglutide-Weight Management (WEGOVY) 0.25 MG/0.5ML SOAJ   Other Relevant Orders   Comprehensive metabolic panel   PCOS (polycystic ovarian syndrome) - Primary    Per pelvic US 09/2020 by [facility]: Endovaginal sonogram was performed today due to the indications outlined above. The sonogram reveals a normal appearing uterus with characteristics as described above. The endometrium and myometrium appear normal. The adnexae were visualized bilaterally and appear normal. The right and left ovary are enlarged and polycystic, suspect PCOS.   Hx of irregular menstrual cycle but has been regular for last 1year.  We discussed importance heart healthy diet and regular exercise Central obesity present, no acanthosis nigricans, acne on face and chest. Treats acne with OVER THE COUNTER acne product Previous hgbA1c at 6%, Tsh normal in 2023  Repeat testosterone (total and free), hgbA1c, lipid panel and CMP Sent metformin if wegovy is not covered by insurance      Relevant Medications   Semaglutide-Weight Management (WEGOVY) 0.25  MG/0.5ML SOAJ   Other Relevant Orders   Hemoglobin A1c   Lipid panel   Testosterone,Free and Total   Seasonal allergic rhinitis due to pollen    Azelastine sent      Relevant Medications   azelastine (ASTELIN) 0.1 % nasal spray   Other Visit Diagnoses     Prediabetes       Relevant Medications   Semaglutide-Weight Management (WEGOVY) 0.25 MG/0.5ML SOAJ   Other Relevant Orders   Comprehensive metabolic panel      Return in about 4 weeks (around 12/06/2022) for Weight management.     Alysia Penna, NP

## 2022-11-08 NOTE — Patient Instructions (Signed)
Schedule fasting lab appt. Need to be fasting 8hrs prior to blood draw. Ok to drink water and take BP meds.  Mediterranean Diet A Mediterranean diet is based on the traditions of countries on the [employer]. It focuses on eating more: Fruits and vegetables. Whole grains, beans, nuts, and seeds. Heart-healthy fats. These are fats that are good for your heart. It involves eating less: Dairy. Meat and eggs. Processed foods with added sugar, salt, and fat. This type of diet can help prevent certain conditions. It can also improve outcomes if you have a long-term (chronic) disease, such as kidney or heart disease. What are tips for following this plan? Reading food labels Check packaged foods for: The serving size. For foods such as rice and pasta, the serving size is the amount of cooked product, not dry. The total fat. Avoid foods with saturated fat or trans fat. Added sugars, such as corn syrup. Shopping  Try to have a balanced diet. Buy a variety of foods, such as: Fresh fruits and vegetables. You may be able to get these from local farmers markets. You can also buy them frozen. Grains, beans, nuts, and seeds. Some of these can be bought in bulk. Fresh seafood. Poultry and eggs. Low-fat dairy products. Buy whole ingredients instead of foods that have already been packaged. If you can't get fresh seafood, buy precooked frozen shrimp or canned fish, such as tuna, salmon, or sardines. Stock your pantry so you always have certain foods on hand, such as olive oil, canned tuna, canned tomatoes, rice, pasta, and beans. Cooking Cook foods with extra-virgin olive oil instead of using butter or other vegetable oils. Have meat as a side dish. Have vegetables or grains as your main dish. This means having meat in small portions or adding small amounts of meat to foods like pasta or stew. Use beans or vegetables instead of meat in common dishes like chili or lasagna. Try out different  cooking methods. Try roasting, broiling, steaming, and sauting vegetables. Add frozen vegetables to soups, stews, pasta, or rice. Add nuts or seeds for added healthy fats and plant protein at each meal. You can add these to yogurt, salads, or vegetable dishes. Marinate fish or vegetables using olive oil, lemon juice, garlic, and fresh herbs. Meal planning Plan to eat a vegetarian meal one day each week. Try to work up to two vegetarian meals, if possible. Eat seafood two or more times a week. Have healthy snacks on hand. These may include: Vegetable sticks with hummus. Greek yogurt. Fruit and nut trail mix. Eat balanced meals. These should include: Fruit: 2-3 servings a day. Vegetables: 4-5 servings a day. Low-fat dairy: 2 servings a day. Fish, poultry, or lean meat: 1 serving a day. Beans and legumes: 2 or more servings a week. Nuts and seeds: 1-2 servings a day. Whole grains: 6-8 servings a day. Extra-virgin olive oil: 3-4 servings a day. Limit red meat and sweets to just a few servings a month. Lifestyle  Try to cook and eat meals with your family. Drink enough fluid to keep your pee (urine) pale yellow. Be active every day. This includes: Aerobic exercise, which is exercise that causes your heart to beat faster. Examples include running and swimming. Leisure activities like gardening, walking, or housework. Get 7-8 hours of sleep each night. Drink red wine if your provider says you can. A glass of wine is 5 oz (150 mL). You may be allowed to have: Up to 1 glass a day if you're  female and not pregnant. Up to 2 glasses a day if you're female. What foods should I eat? Fruits Apples. Apricots. Avocado. Berries. Bananas. Cherries. Dates. Figs. Grapes. Lemons. Melon. Oranges. Peaches. Plums. Pomegranate. Vegetables Artichokes. Beets. Broccoli. Cabbage. Carrots. Eggplant. Green beans. Chard. Kale. Spinach. Onions. Leeks. Peas. Squash. Tomatoes. Peppers. Radishes. Grains Whole-grain  pasta. Brown rice. Bulgur wheat. Polenta. Couscous. Whole-wheat bread. Orpah Cobb. Meats and other proteins Beans. Almonds. Sunflower seeds. Pine nuts. Peanuts. Cod. Salmon. Scallops. Shrimp. Tuna. Tilapia. Clams. Oysters. Eggs. Chicken or [place] without skin. Dairy Low-fat milk. Cheese. Greek yogurt. Fats and oils Extra-virgin olive oil. Avocado oil. Grapeseed oil. Beverages Water. Red wine. Herbal tea. Sweets and desserts Greek yogurt with honey. Baked apples. Poached pears. Trail mix. Seasonings and condiments Basil. Cilantro. Coriander. Cumin. Mint. Parsley. Sage. Rosemary. Tarragon. Garlic. Oregano. Thyme. Pepper. Balsamic vinegar. Tahini. Hummus. Tomato sauce. Olives. Mushrooms. The items listed above may not be all the foods and drinks you can have. Talk to a dietitian to learn more. What foods should I limit? This is a list of foods that should be eaten rarely. Fruits Fruit canned in syrup. Vegetables Deep-fried potatoes, like [place] fries. Grains Packaged pasta or rice dishes. Cereal with added sugar. Snacks with added sugar. Meats and other proteins Beef. Pork. Lamb. Chicken or [place] with skin. Hot dogs. Tomasa Blase. Dairy Ice cream. Sour cream. Whole milk. Fats and oils Butter. Canola oil. Vegetable oil. Beef fat (tallow). Lard. Beverages Juice. Sugar-sweetened soft drinks. Beer. Liquor and spirits. Sweets and desserts Cookies. Cakes. Pies. Candy. Seasonings and condiments Mayonnaise. Pre-made sauces and marinades. The items listed above may not be all the foods and drinks you should limit. Talk to a dietitian to learn more. Where to find more information American Heart Association (AHA): heart.org This information is not intended to replace advice given to you by your health care provider. Make sure you discuss any questions you have with your health care provider. Document Revised: 04/17/2022 Document Reviewed: 04/17/2022 Elsevier Patient Education  2024 [employer].

## 2022-11-10 ENCOUNTER — Other Ambulatory Visit (HOSPITAL_COMMUNITY): Payer: Self-pay

## 2022-11-10 ENCOUNTER — Telehealth: Payer: Self-pay

## 2022-11-10 NOTE — Telephone Encounter (Addendum)
Pharmacy Patient Advocate Encounter   Received notification from [facility] that prior authorization for Dorothy Osborne  is required/requested.   Insurance verification completed.   The patient is insured through [employer] .   Per test claim: PA required; PA submitted to [employer] via CoverMyMeds Key/confirmation #/EOC Key: [id number] Status is pending

## 2022-11-11 ENCOUNTER — Other Ambulatory Visit: Payer: Managed Care, Other (non HMO)

## 2022-11-14 ENCOUNTER — Other Ambulatory Visit (INDEPENDENT_AMBULATORY_CARE_PROVIDER_SITE_OTHER): Payer: Managed Care, Other (non HMO)

## 2022-11-14 ENCOUNTER — Other Ambulatory Visit (HOSPITAL_COMMUNITY): Payer: Self-pay

## 2022-11-14 DIAGNOSIS — E66812 Obesity, class 2: Secondary | ICD-10-CM

## 2022-11-14 DIAGNOSIS — E282 Polycystic ovarian syndrome: Secondary | ICD-10-CM

## 2022-11-14 DIAGNOSIS — Z6838 Body mass index (BMI) 38.0-38.9, adult: Secondary | ICD-10-CM

## 2022-11-14 DIAGNOSIS — R7303 Prediabetes: Secondary | ICD-10-CM | POA: Diagnosis not present

## 2022-11-14 LAB — COMPREHENSIVE METABOLIC PANEL
ALT: 11 U/L (ref 0–35)
AST: 13 U/L (ref 0–37)
Albumin: 4.2 g/dL (ref 3.5–5.2)
Alkaline Phosphatase: 65 U/L (ref 39–117)
BUN: 9 mg/dL (ref 6–23)
CO2: 29 meq/L (ref 19–32)
Calcium: 9.6 mg/dL (ref 8.4–10.5)
Chloride: 103 meq/L (ref 96–112)
Creatinine, Ser: 0.98 mg/dL (ref 0.40–1.20)
GFR: 75.61 mL/min (ref >60.00)
Glucose, Bld: 92 mg/dL (ref 70–99)
Potassium: 4 meq/L (ref 3.5–5.1)
Sodium: 138 meq/L (ref 135–145)
Total Bilirubin: 0.4 mg/dL (ref 0.2–1.2)
Total Protein: 7.3 g/dL (ref 6.0–8.3)

## 2022-11-14 LAB — LIPID PANEL
Cholesterol: 123 mg/dL (ref 0–200)
HDL: 30.4 mg/dL — ABNORMAL LOW (ref >39.00)
LDL Cholesterol: 68 mg/dL (ref 0–99)
NonHDL: 93.08
Total CHOL/HDL Ratio: 4
Triglycerides: 127 mg/dL (ref 0.0–149.0)
VLDL: 25.4 mg/dL (ref 0.0–40.0)

## 2022-11-14 LAB — HEMOGLOBIN A1C: Hgb A1c MFr Bld: 6.1 % (ref 4.6–6.5)

## 2022-11-14 NOTE — Telephone Encounter (Signed)
Pharmacy Patient Advocate Encounter  Received notification from [employer] that Prior Authorization for Reginal Lutes has been APPROVED from 9.24.24 to 5.22.25. Ran test claim, Copay is $24.99. This test claim was processed through [facility]- copay amounts may vary at other pharmacies due to pharmacy/plan contracts, or as the patient moves through the different stages of their insurance plan.

## 2022-11-18 LAB — TESTOSTERONE,FREE AND TOTAL
Testosterone, Free: 1.3 pg/mL (ref 0.0–4.2)
Testosterone: 36 ng/dL (ref 8–60)

## 2022-12-06 ENCOUNTER — Ambulatory Visit: Payer: Managed Care, Other (non HMO) | Admitting: Nurse Practitioner

## 2022-12-06 ENCOUNTER — Encounter: Payer: Self-pay | Admitting: Nurse Practitioner

## 2022-12-06 VITALS — BP 129/78 | HR 80 | Temp 97.8°F | Resp 18 | Ht 64.0 in | Wt 209.0 lb

## 2022-12-06 DIAGNOSIS — E66812 Obesity, class 2: Secondary | ICD-10-CM | POA: Diagnosis not present

## 2022-12-06 DIAGNOSIS — R7303 Prediabetes: Secondary | ICD-10-CM | POA: Diagnosis not present

## 2022-12-06 DIAGNOSIS — E282 Polycystic ovarian syndrome: Secondary | ICD-10-CM | POA: Diagnosis not present

## 2022-12-06 DIAGNOSIS — Z6835 Body mass index (BMI) 35.0-35.9, adult: Secondary | ICD-10-CM

## 2022-12-06 MED ORDER — WEGOVY 0.5 MG/0.5ML ~~LOC~~ SOAJ: 0.5000 mg | SUBCUTANEOUS | 0 refills | Status: DC

## 2022-12-06 MED ORDER — WEGOVY 1 MG/0.5ML ~~LOC~~ SOAJ: 1.0000 mg | SUBCUTANEOUS | 0 refills | Status: DC

## 2022-12-06 NOTE — Patient Instructions (Signed)
Continue Heart healthy diet and daily exercise. Maintain current medications.

## 2022-12-06 NOTE — Progress Notes (Signed)
Established Patient Visit  Patient: Dorothy Osborne   DOB: [birth year]   32 y.o. Female  MRN: [id number] Visit Date: 12/06/2022  Subjective:    Chief Complaint  Patient presents with   OFFICE VISIT     1 month follow up weight management    HPI Obesity Lost 2lbs with wegovy 0.25mg  weekly Denies any adverse side effects Wt Readings from Last 3 Encounters:  12/06/22 209 lb (94.8 kg)  11/08/22 211 lb 3.2 oz (95.8 kg)  06/15/21 191 lb (86.6 kg)    Increase wegovy dose to 0.5mg  x4weeks, then 1mg  x 4weeks F/up in 2months   Reviewed medical, surgical, and social history today  Medications: Outpatient Medications Prior to Visit  Medication Sig   azelastine (ASTELIN) 0.1 % nasal spray Place 1 spray into both nostrils 2 (two) times daily. Use in each nostril as directed   omeprazole (PRILOSEC) 20 MG capsule Take 1 capsule (20 mg total) by mouth 2 (two) times daily before a meal.   [DISCONTINUED] Semaglutide-Weight Management (WEGOVY) 0.25 MG/0.5ML SOAJ Inject 0.25 mg into the skin once a week.   No facility-administered medications prior to visit.   Reviewed past medical and social history.   ROS per HPI above  Last metabolic panel Lab Results  Component Value Date   GLUCOSE 92 11/14/2022   NA 138 11/14/2022   K 4.0 11/14/2022   CL 103 11/14/2022   CO2 29 11/14/2022   BUN 9 11/14/2022   CREATININE 0.98 11/14/2022   GFR 75.61 11/14/2022   CALCIUM 9.6 11/14/2022   PROT 7.3 11/14/2022   ALBUMIN 4.2 11/14/2022   BILITOT 0.4 11/14/2022   ALKPHOS 65 11/14/2022   AST 13 11/14/2022   ALT 11 11/14/2022   ANIONGAP 9 10/14/2019   Last lipids Lab Results  Component Value Date   CHOL 123 11/14/2022   HDL 30.40 (L) 11/14/2022   LDLCALC 68 11/14/2022   TRIG 127.0 11/14/2022   CHOLHDL 4 11/14/2022   Last hemoglobin A1c Lab Results  Component Value Date   HGBA1C 6.1 11/14/2022        Objective:  BP 129/78 (BP Location: Left Arm, Patient Position:  Sitting, Cuff Size: Large)   Pulse 80   Temp 97.8 F (36.6 C) (Temporal)   Resp 18   Ht 5\' 4"  (1.626 m)   Wt 209 lb (94.8 kg)   LMP 11/01/2022 (Exact Date)   SpO2 97%   BMI 35.87 kg/m      Physical Exam Vitals and nursing note reviewed.  Cardiovascular:     Rate and Rhythm: Normal rate.     Pulses: Normal pulses.  Pulmonary:     Effort: Pulmonary effort is normal.  Neurological:     Mental Status: She is alert and oriented to person, place, and time.     No results found for any visits on 12/06/22.    Assessment & Plan:    Problem List Items Addressed This Visit     Obesity - Primary    Lost 2lbs with wegovy 0.25mg  weekly Denies any adverse side effects Wt Readings from Last 3 Encounters:  12/06/22 209 lb (94.8 kg)  11/08/22 211 lb 3.2 oz (95.8 kg)  06/15/21 191 lb (86.6 kg)    Increase wegovy dose to 0.5mg  x4weeks, then 1mg  x 4weeks F/up in 2months      Relevant Medications   Semaglutide-Weight Management (WEGOVY) 0.5 MG/0.5ML SOAJ   Semaglutide-Weight  Management (WEGOVY) 1 MG/0.5ML SOAJ (Start on 01/05/2023)   PCOS (polycystic ovarian syndrome)   Relevant Medications   Semaglutide-Weight Management (WEGOVY) 0.5 MG/0.5ML SOAJ   Semaglutide-Weight Management (WEGOVY) 1 MG/0.5ML SOAJ (Start on 01/05/2023)   Prediabetes   Relevant Medications   Semaglutide-Weight Management (WEGOVY) 0.5 MG/0.5ML SOAJ   Semaglutide-Weight Management (WEGOVY) 1 MG/0.5ML SOAJ (Start on 01/05/2023)   Return in about 2 months (around 02/05/2023) for Weight management, hyperlipidemia (fasting).     Alysia Penna, NP

## 2022-12-06 NOTE — Assessment & Plan Note (Signed)
Lost 2lbs with wegovy 0.25mg  weekly Denies any adverse side effects Wt Readings from Last 3 Encounters:  12/06/22 209 lb (94.8 kg)  11/08/22 211 lb 3.2 oz (95.8 kg)  06/15/21 191 lb (86.6 kg)    Increase wegovy dose to 0.5mg  x4weeks, then 1mg  x 4weeks F/up in 2months

## 2023-02-01 ENCOUNTER — Other Ambulatory Visit: Payer: Self-pay | Admitting: Nurse Practitioner

## 2023-02-01 DIAGNOSIS — R7303 Prediabetes: Secondary | ICD-10-CM

## 2023-02-01 DIAGNOSIS — E282 Polycystic ovarian syndrome: Secondary | ICD-10-CM

## 2023-02-01 DIAGNOSIS — E66812 Obesity, class 2: Secondary | ICD-10-CM

## 2023-02-07 ENCOUNTER — Encounter: Payer: Self-pay | Admitting: Nurse Practitioner

## 2023-02-07 ENCOUNTER — Ambulatory Visit: Payer: Managed Care, Other (non HMO) | Admitting: Nurse Practitioner

## 2023-02-07 VITALS — BP 140/82 | HR 76 | Temp 98.8°F | Resp 18 | Ht 64.0 in | Wt 194.0 lb

## 2023-02-07 DIAGNOSIS — R7303 Prediabetes: Secondary | ICD-10-CM

## 2023-02-07 DIAGNOSIS — E66811 Obesity, class 1: Secondary | ICD-10-CM | POA: Diagnosis not present

## 2023-02-07 DIAGNOSIS — R030 Elevated blood-pressure reading, without diagnosis of hypertension: Secondary | ICD-10-CM | POA: Insufficient documentation

## 2023-02-07 DIAGNOSIS — E282 Polycystic ovarian syndrome: Secondary | ICD-10-CM | POA: Diagnosis not present

## 2023-02-07 DIAGNOSIS — E6609 Other obesity due to excess calories: Secondary | ICD-10-CM

## 2023-02-07 DIAGNOSIS — Z6833 Body mass index (BMI) 33.0-33.9, adult: Secondary | ICD-10-CM

## 2023-02-07 DIAGNOSIS — E66812 Obesity, class 2: Secondary | ICD-10-CM

## 2023-02-07 DIAGNOSIS — Z6835 Body mass index (BMI) 35.0-35.9, adult: Secondary | ICD-10-CM

## 2023-02-07 MED ORDER — WEGOVY 1 MG/0.5ML ~~LOC~~ SOAJ: 1.0000 mg | SUBCUTANEOUS | 1 refills | Status: DC

## 2023-02-07 NOTE — Patient Instructions (Signed)
Monitor BP daily in AM Send BP reading via mychart on Thursday Maintain low salt diet. Maintain current med dose

## 2023-02-07 NOTE — Progress Notes (Signed)
Established Patient Visit  Patient: Dorothy Osborne   DOB: [birth year]   33 y.o. Female  MRN: [id number] Visit Date: 02/07/2023  Subjective:    Chief Complaint  Patient presents with   weight managment     2 month follow up weight management and hyperlipidemia    HPI Obesity Lost 15lbs with wegovy Denies any adverse effects. Exercise: videos and home increasing daily steps Diet: 2balanced meals with 2snacks. Hydration: at least 60oz daily. Wt Readings from Last 3 Encounters:  02/07/23 194 lb (88 kg)  12/06/22 209 lb (94.8 kg)  11/08/22 211 lb 3.2 oz (95.8 kg)    Maintain wegovy dose, heart healthy diet and daily exercise F/up in 2months  Elevated BP without diagnosis of hypertension Associated with headache No dizziness or blurry vision or focal weakness or CP or palpitation or change in speech. denies BP Readings from Last 3 Encounters:  02/07/23 (!) 140/82  12/06/22 129/78  11/08/22 125/71    Monitor BP daily in AM Send BP reading via mychart on Thursday Maintain low salt diet.  Reviewed medical, surgical, and social history today  Medications: Outpatient Medications Prior to Visit  Medication Sig   azelastine (ASTELIN) 0.1 % nasal spray Place 1 spray into both nostrils 2 (two) times daily. Use in each nostril as directed   omeprazole (PRILOSEC) 20 MG capsule Take 1 capsule (20 mg total) by mouth 2 (two) times daily before a meal.   [DISCONTINUED] Semaglutide-Weight Management (WEGOVY) 0.5 MG/0.5ML SOAJ Inject 0.5 mg into the skin once a week.   [DISCONTINUED] Semaglutide-Weight Management (WEGOVY) 1 MG/0.5ML SOAJ Inject 1 mg into the skin once a week.   No facility-administered medications prior to visit.   Reviewed past medical and social history.   ROS per HPI above      Objective:  BP (!) 140/82 (BP Location: Left Arm, Patient Position: Sitting, Cuff Size: Normal)   Pulse 76   Temp 98.8 F (37.1 C) (Temporal)   Resp 18   Ht 5\' 4"   (1.626 m)   Wt 194 lb (88 kg)   LMP 01/18/2023 (Exact Date)   SpO2 98%   BMI 33.30 kg/m      Physical Exam Cardiovascular:     Rate and Rhythm: Normal rate and regular rhythm.     Pulses: Normal pulses.     Heart sounds: Normal heart sounds.  Pulmonary:     Effort: Pulmonary effort is normal.     Breath sounds: Normal breath sounds.  Musculoskeletal:     Right lower leg: No edema.     Left lower leg: No edema.  Neurological:     Mental Status: She is alert and oriented to person, place, and time.     No results found for any visits on 02/07/23.    Assessment & Plan:    Problem List Items Addressed This Visit     Elevated BP without diagnosis of hypertension   Associated with headache No dizziness or blurry vision or focal weakness or CP or palpitation or change in speech. denies BP Readings from Last 3 Encounters:  02/07/23 (!) 140/82  12/06/22 129/78  11/08/22 125/71    Monitor BP daily in AM Send BP reading via mychart on Thursday Maintain low salt diet.      Obesity - Primary   Lost 15lbs with wegovy Denies any adverse effects. Exercise: videos and home increasing daily steps Diet: 2balanced meals  with 2snacks. Hydration: at least 60oz daily. Wt Readings from Last 3 Encounters:  02/07/23 194 lb (88 kg)  12/06/22 209 lb (94.8 kg)  11/08/22 211 lb 3.2 oz (95.8 kg)    Maintain wegovy dose, heart healthy diet and daily exercise F/up in 2months      Relevant Medications   Semaglutide-Weight Management (WEGOVY) 1 MG/0.5ML SOAJ   PCOS (polycystic ovarian syndrome)   Relevant Medications   Semaglutide-Weight Management (WEGOVY) 1 MG/0.5ML SOAJ   Prediabetes   Relevant Medications   Semaglutide-Weight Management (WEGOVY) 1 MG/0.5ML SOAJ   Return in about 2 months (around 04/07/2023) for elevated BP and , Weight management.     Alysia Penna, NP

## 2023-02-07 NOTE — Assessment & Plan Note (Addendum)
Associated with headache No dizziness or blurry vision or focal weakness or CP or palpitation or change in speech. denies BP Readings from Last 3 Encounters:  02/07/23 (!) 140/82  12/06/22 129/78  11/08/22 125/71    Monitor BP daily in AM Send BP reading via mychart on Thursday Maintain low salt diet.

## 2023-02-07 NOTE — Assessment & Plan Note (Addendum)
Lost 15lbs with wegovy Denies any adverse effects. Exercise: videos and home increasing daily steps Diet: 2balanced meals with 2snacks. Hydration: at least 60oz daily. Wt Readings from Last 3 Encounters:  02/07/23 194 lb (88 kg)  12/06/22 209 lb (94.8 kg)  11/08/22 211 lb 3.2 oz (95.8 kg)    Maintain wegovy dose, heart healthy diet and daily exercise F/up in 2months

## 2023-04-07 ENCOUNTER — Ambulatory Visit: Payer: Managed Care, Other (non HMO) | Admitting: Nurse Practitioner

## 2023-04-07 ENCOUNTER — Encounter: Payer: Self-pay | Admitting: Nurse Practitioner

## 2023-04-07 VITALS — BP 128/70 | HR 84 | Temp 98.2°F | Ht 64.0 in | Wt 186.0 lb

## 2023-04-07 DIAGNOSIS — E282 Polycystic ovarian syndrome: Secondary | ICD-10-CM | POA: Diagnosis not present

## 2023-04-07 DIAGNOSIS — E66811 Obesity, class 1: Secondary | ICD-10-CM

## 2023-04-07 DIAGNOSIS — R7303 Prediabetes: Secondary | ICD-10-CM

## 2023-04-07 DIAGNOSIS — Z30011 Encounter for initial prescription of contraceptive pills: Secondary | ICD-10-CM

## 2023-04-07 DIAGNOSIS — Z309 Encounter for contraceptive management, unspecified: Secondary | ICD-10-CM | POA: Insufficient documentation

## 2023-04-07 DIAGNOSIS — E6609 Other obesity due to excess calories: Secondary | ICD-10-CM

## 2023-04-07 DIAGNOSIS — Z6831 Body mass index (BMI) 31.0-31.9, adult: Secondary | ICD-10-CM

## 2023-04-07 HISTORY — DX: Encounter for initial prescription of contraceptive pills: Z30.011

## 2023-04-07 LAB — POCT URINE PREGNANCY: Preg Test, Ur: NEGATIVE

## 2023-04-07 MED ORDER — WEGOVY 0.5 MG/0.5ML ~~LOC~~ SOAJ
0.5000 mg | SUBCUTANEOUS | 1 refills | Status: DC
Start: 2023-04-07 — End: 2023-09-08

## 2023-04-07 MED ORDER — NORGESTIMATE-ETH ESTRADIOL 0.25-35 MG-MCG PO TABS: 1.0000 | ORAL_TABLET | Freq: Every day | ORAL | 11 refills | Status: AC

## 2023-04-07 NOTE — Progress Notes (Signed)
 Established Patient Visit  Patient: Dorothy Osborne   DOB: 1990/07/21   33 y.o. Female  MRN: [id number] Visit Date: 04/09/2023  Subjective:    Chief Complaint  Patient presents with   Obesity   HPI Obesity Last wegovy dose 3weeks ago due to change in insurance. Lost 8lbs in last 2months Total weight loss: 25lbs in last 5months with use of wegovy Exercise:EOD walking, tennis and video exercise Diet: low carb, low fat, small meal portion Hx of PCOS and prediabetes No weight loss noted in past with lifestyle modifications x 3months Wt Readings from Last 3 Encounters:  04/07/23 186 lb (84.4 kg)  02/07/23 194 lb (88 kg)  12/06/22 209 lb (94.8 kg)    Advised to use myfitness pal to track daily caloric intake. Encouraged to maintain daily exercise. Resume wegovy 0.5mg  weekly F/up in 2months  Encounter for initial prescription of contraceptive pills previous use of depoprovera inj and sprintec. Wants to resume use of spintec COC UTD with PAP smear-2023- normal with negative HPV LMP 03/09/2023. Sexually active with condom denies need for STD screen No tobacco use No hx of PE/DVT.  Check urine pregnancy today Advised about possible decreased effectiveness of COC with use of GLP-1RA injection. Advised to continue use of condoms. She verbalized understanding Start sprintec  Reviewed medical, surgical, and social history today  Medications: Outpatient Medications Prior to Visit  Medication Sig   azelastine (ASTELIN) 0.1 % nasal spray Place 1 spray into both nostrils 2 (two) times daily. Use in each nostril as directed   [DISCONTINUED] Semaglutide-Weight Management (WEGOVY) 1 MG/0.5ML SOAJ Inject 1 mg into the skin once a week.   [DISCONTINUED] omeprazole (PRILOSEC) 20 MG capsule Take 1 capsule (20 mg total) by mouth 2 (two) times daily before a meal. (Patient not taking: Reported on 04/07/2023)   No facility-administered medications prior to visit.   Reviewed  past medical and social history.   ROS per HPI above      Objective:  BP 128/70 (BP Location: Left Arm, Patient Position: Sitting, Cuff Size: Normal)   Pulse 84   Temp 98.2 F (36.8 C) (Temporal)   Ht 5\' 4"  (1.626 m)   Wt 186 lb (84.4 kg)   LMP 03/10/2023   SpO2 98%   BMI 31.93 kg/m      Physical Exam Cardiovascular:     Rate and Rhythm: Normal rate.     Pulses: Normal pulses.  Pulmonary:     Effort: Pulmonary effort is normal.  Neurological:     Mental Status: She is alert and oriented to person, place, and time.  Psychiatric:        Mood and Affect: Mood normal.        Behavior: Behavior normal.        Thought Content: Thought content normal.     Results for orders placed or performed in visit on 04/07/23  POCT urine pregnancy  Result Value Ref Range   Preg Test, Ur Negative Negative      Assessment & Plan:    Problem List Items Addressed This Visit     Encounter for initial prescription of contraceptive pills   previous use of depoprovera inj and sprintec. Wants to resume use of spintec COC UTD with PAP smear-2023- normal with negative HPV LMP 03/09/2023. Sexually active with condom denies need for STD screen No tobacco use No hx of PE/DVT.  Check urine pregnancy today Advised  about possible decreased effectiveness of COC with use of GLP-1RA injection. Advised to continue use of condoms. She verbalized understanding Start sprintec      Relevant Medications   norgestimate-ethinyl estradiol (SPRINTEC 28) 0.25-35 MG-MCG tablet   Other Relevant Orders   POCT urine pregnancy (Completed)   Obesity - Primary   Last wegovy dose 3weeks ago due to change in insurance. Lost 8lbs in last 2months Total weight loss: 25lbs in last 5months with use of wegovy Exercise:EOD walking, tennis and video exercise Diet: low carb, low fat, small meal portion Hx of PCOS and prediabetes No weight loss noted in past with lifestyle modifications x 3months Wt Readings from  Last 3 Encounters:  04/07/23 186 lb (84.4 kg)  02/07/23 194 lb (88 kg)  12/06/22 209 lb (94.8 kg)    Advised to use myfitness pal to track daily caloric intake. Encouraged to maintain daily exercise. Resume wegovy 0.5mg  weekly F/up in 2months      Relevant Medications   Semaglutide-Weight Management (WEGOVY) 0.5 MG/0.5ML SOAJ   PCOS (polycystic ovarian syndrome)   Relevant Medications   Semaglutide-Weight Management (WEGOVY) 0.5 MG/0.5ML SOAJ   Prediabetes   Relevant Medications   Semaglutide-Weight Management (WEGOVY) 0.5 MG/0.5ML SOAJ   Return in about 2 months (around 06/07/2023) for Weight management, prediabetes, hyperlipidemia (fasting).     Alysia Penna, NP

## 2023-04-07 NOTE — Assessment & Plan Note (Addendum)
 Last wegovy dose 3weeks ago due to change in insurance. Lost 8lbs in last 2months Total weight loss: 25lbs in last 5months with use of wegovy Exercise:EOD walking, tennis and video exercise Diet: low carb, low fat, small meal portion Hx of PCOS and prediabetes No weight loss noted in past with lifestyle modifications x 3months Wt Readings from Last 3 Encounters:  04/07/23 186 lb (84.4 kg)  02/07/23 194 lb (88 kg)  12/06/22 209 lb (94.8 kg)    Advised to use myfitness pal to track daily caloric intake. Encouraged to maintain daily exercise. Resume wegovy 0.5mg  weekly F/up in 2months

## 2023-04-07 NOTE — Assessment & Plan Note (Addendum)
 previous use of depoprovera inj and sprintec. Wants to resume use of spintec COC UTD with PAP smear-2023- normal with negative HPV LMP 03/09/2023. Sexually active with condom denies need for STD screen No tobacco use No hx of PE/DVT.  Check urine pregnancy today Advised about possible decreased effectiveness of COC with use of GLP-1RA injection. Advised to continue use of condoms. She verbalized understanding Start sprintec

## 2023-04-09 ENCOUNTER — Encounter: Payer: Self-pay | Admitting: Nurse Practitioner

## 2023-06-09 ENCOUNTER — Ambulatory Visit: Admitting: Nurse Practitioner

## 2023-07-03 ENCOUNTER — Ambulatory Visit: Admitting: Nurse Practitioner

## 2023-08-03 ENCOUNTER — Ambulatory Visit: Admitting: Nurse Practitioner

## 2023-08-18 ENCOUNTER — Telehealth: Payer: Self-pay | Admitting: Nurse Practitioner

## 2023-08-18 NOTE — Telephone Encounter (Signed)
 06/09/2023 same day cancel via mychart 08/03/2023 same day cancel via mychart  Final warning sent via mail and mychart

## 2023-09-08 ENCOUNTER — Encounter: Payer: Self-pay | Admitting: Nurse Practitioner

## 2023-09-08 ENCOUNTER — Ambulatory Visit: Admitting: Nurse Practitioner

## 2023-09-08 VITALS — BP 132/78 | HR 78 | Temp 98.9°F | Ht 64.0 in | Wt 195.2 lb

## 2023-09-08 DIAGNOSIS — R7303 Prediabetes: Secondary | ICD-10-CM

## 2023-09-08 DIAGNOSIS — Z6833 Body mass index (BMI) 33.0-33.9, adult: Secondary | ICD-10-CM

## 2023-09-08 DIAGNOSIS — E66811 Obesity, class 1: Secondary | ICD-10-CM | POA: Diagnosis not present

## 2023-09-08 DIAGNOSIS — E6609 Other obesity due to excess calories: Secondary | ICD-10-CM | POA: Diagnosis not present

## 2023-09-08 DIAGNOSIS — Z0000 Encounter for general adult medical examination without abnormal findings: Secondary | ICD-10-CM

## 2023-09-08 DIAGNOSIS — Z0001 Encounter for general adult medical examination with abnormal findings: Secondary | ICD-10-CM

## 2023-09-08 LAB — COMPREHENSIVE METABOLIC PANEL WITH GFR
ALT: 10 U/L (ref 0–35)
AST: 14 U/L (ref 0–37)
Albumin: 4.2 g/dL (ref 3.5–5.2)
Alkaline Phosphatase: 51 U/L (ref 39–117)
BUN: 10 mg/dL (ref 6–23)
CO2: 26 meq/L (ref 19–32)
Calcium: 9.4 mg/dL (ref 8.4–10.5)
Chloride: 101 meq/L (ref 96–112)
Creatinine, Ser: 1.2 mg/dL (ref 0.40–1.20)
GFR: 58.96 mL/min — ABNORMAL LOW (ref >60.00)
Glucose, Bld: 96 mg/dL (ref 70–99)
Potassium: 4.2 meq/L (ref 3.5–5.1)
Sodium: 137 meq/L (ref 135–145)
Total Bilirubin: 0.4 mg/dL (ref 0.2–1.2)
Total Protein: 7.9 g/dL (ref 6.0–8.3)

## 2023-09-08 LAB — TSH: TSH: 0.62 u[IU]/mL (ref 0.35–5.50)

## 2023-09-08 LAB — HEMOGLOBIN A1C: Hgb A1c MFr Bld: 6.4 % (ref 4.6–6.5)

## 2023-09-08 NOTE — Patient Instructions (Signed)
 Go to lab Maintain Heart healthy diet and daily exercise. Maintain current medications.

## 2023-09-08 NOTE — Assessment & Plan Note (Signed)
 Repeat hgbA1c

## 2023-09-08 NOTE — Assessment & Plan Note (Addendum)
 Started wellness program via [facility]: exercise program, nutrition guidance, and mental health counselor. Wt Readings from Last 3 Encounters:  09/08/23 195 lb 3.2 oz (88.5 kg)  04/07/23 186 lb (84.4 kg)  02/07/23 194 lb (88 kg)

## 2023-09-08 NOTE — Progress Notes (Signed)
 Complete physical exam  Patient: Dorothy Osborne   DOB: February 04, 1990   33 y.o. Female  MRN: [id number] Visit Date: 09/08/2023  Subjective:    Chief Complaint  Patient presents with   Annual Exam    Fasting, needs form completed   Dorothy Osborne is a 33 y.o. female who presents today for a complete physical exam. She reports consuming a general diet. Participates in wellness program She generally feels well. She reports sleeping well. She does have additional problems to discuss today.  Vision:Yes Dental:Yes STD Screen:No  BP Readings from Last 3 Encounters:  09/08/23 132/78  04/07/23 128/70  02/07/23 (!) 140/82   Wt Readings from Last 3 Encounters:  09/08/23 195 lb 3.2 oz (88.5 kg)  04/07/23 186 lb (84.4 kg)  02/07/23 194 lb (88 kg)    Most recent fall risk assessment:    02/07/2023    1:07 PM  Fall Risk   Falls in the past year? 0  Number falls in past yr: 0  Injury with Fall? 0  Risk for fall due to : Other (Comment)  Follow up Falls evaluation completed     Depression screen:Yes - No Depression Most recent depression screenings:    02/07/2023    1:07 PM 12/06/2022    1:21 PM  PHQ 2/9 Scores  PHQ - 2 Score 0 0  PHQ- 9 Score  3    HPI  Obesity Started wellness program via [facility]: exercise program, nutrition guidance, and mental health counselor. Wt Readings from Last 3 Encounters:  09/08/23 195 lb 3.2 oz (88.5 kg)  04/07/23 186 lb (84.4 kg)  02/07/23 194 lb (88 kg)     Prediabetes Repeat hgbA1c  Past Medical History:  Diagnosis Date   Allergy 2012   Anxiety    Depression    Encounter for initial prescription of contraceptive pills 04/07/2023   Hypertension 2021   Past Surgical History:  Procedure Laterality Date   LAPAROSCOPY  10/2020   UPPER GASTROINTESTINAL ENDOSCOPY     Social History   Socioeconomic History   Marital status: Single    Spouse name: Not on file   Number of children: Not on file   Years of education: Not  on file   Highest education level: Bachelor's degree (e.g., BA, AB, BS)  Occupational History   Not on file  Tobacco Use   Smoking status: Never   Smokeless tobacco: Never  Vaping Use   Vaping status: Never Used  Substance and Sexual Activity   Alcohol use: Yes    Comment: 1-2 times per month   Drug use: Yes    Frequency: 3.0 times per week    Types: Marijuana   Sexual activity: Not Currently    Birth control/protection: None  Other Topics Concern   Not on file  Social History Narrative   Not on file   Social Drivers of Health   Financial Resource Strain: Low Risk  (06/08/2023)   Overall Financial Resource Strain (CARDIA)    Difficulty of Paying Living Expenses: Not very hard  Food Insecurity: Food Insecurity Present (06/08/2023)   Hunger Vital Sign    Worried About Running Out of Food in the Last Year: Often true    Ran Out of Food in the Last Year: Often true  Transportation Needs: No Transportation Needs (06/08/2023)   PRAPARE - Administrator, Civil Service (Medical): No    Lack of Transportation (Non-Medical): No  Physical Activity: Insufficiently Active (06/08/2023)  Exercise Vital Sign    Days of Exercise per Week: 2 days    Minutes of Exercise per Session: 40 min  Stress: Stress Concern Present (06/08/2023)   [employer] of Occupational Health - Occupational Stress Questionnaire    Feeling of Stress: To some extent  Social Connections: Moderately Integrated (06/08/2023)   Social Connection and Isolation Panel    Frequency of Communication with Friends and Family: Three times a week    Frequency of Social Gatherings with Friends and Family: Three times a week    Attends Religious Services: More than 4 times per year    Active Member of Clubs or Organizations: Yes    Attends Engineer, structural: More than 4 times per year    Marital Status: Divorced  Catering manager Violence: Not on file   Family Status  Relation Name Status   Mother  Dawn (Not Specified)   Mat Aunt Tam (Not Specified)   Mat Sales executive (Not Specified)   MGM Rosemary (Not Specified)   Neg Hx  (Not Specified)  No partnership data on file   Family History  Problem Relation Age of Onset   Anxiety disorder Mother    Depression Mother    Drug abuse Mother    Depression Maternal Aunt    Hypertension Maternal Aunt    Obesity Maternal Aunt    Depression Maternal Uncle    Diabetes Maternal Grandmother    Hypertension Maternal Grandmother    Kidney disease Maternal Grandmother    Obesity Maternal Grandmother    Colon cancer Neg Hx    Esophageal cancer Neg Hx    Rectal cancer Neg Hx    Stomach cancer Neg Hx    Allergies  Allergen Reactions   Pollen Extract Cough, Itching and Shortness Of Breath   Mixed Ragweed Cough and Itching    Patient Care Team: Sina Lucchesi, Roselie Rockford, NP as PCP - General (Internal Medicine)   Medications: Outpatient Medications Prior to Visit  Medication Sig   azelastine  (ASTELIN ) 0.1 % nasal spray Place 1 spray into both nostrils 2 (two) times daily. Use in each nostril as directed   norgestimate -ethinyl estradiol  (SPRINTEC 28) 0.25-35 MG-MCG tablet Take 1 tablet by mouth daily.   spironolactone (ALDACTONE) 50 MG tablet Take 50 mg by mouth daily.   tretinoin (RETIN-A) 0.05 % cream Apply 1 Application topically at bedtime.   [DISCONTINUED] Semaglutide -Weight Management (WEGOVY ) 0.5 MG/0.5ML SOAJ Inject 0.5 mg into the skin once a week.   No facility-administered medications prior to visit.    Review of Systems  Constitutional:  Negative for activity change, appetite change and unexpected weight change.  Respiratory: Negative.    Cardiovascular: Negative.   Gastrointestinal: Negative.   Endocrine: Negative for cold intolerance and heat intolerance.  Genitourinary: Negative.   Musculoskeletal: Negative.   Skin: Negative.   Neurological: Negative.   Hematological: Negative.   Psychiatric/Behavioral:  Negative for  behavioral problems, decreased concentration, dysphoric mood, hallucinations, self-injury, sleep disturbance and suicidal ideas. The patient is not nervous/anxious.         Objective:  BP 132/78 (BP Location: Left Arm, Patient Position: Sitting, Cuff Size: Large)   Pulse 78   Temp 98.9 F (37.2 C) (Oral)   Ht 5' 4 (1.626 m)   Wt 195 lb 3.2 oz (88.5 kg)   LMP 08/13/2023   SpO2 98%   BMI 33.51 kg/m     Physical Exam Vitals and nursing note reviewed.  Constitutional:      General:  She is not in acute distress.    Appearance: She is obese.  HENT:     Right Ear: Tympanic membrane, ear canal and external ear normal.     Left Ear: Tympanic membrane, ear canal and external ear normal.     Nose: Nose normal.  Eyes:     Extraocular Movements: Extraocular movements intact.     Conjunctiva/sclera: Conjunctivae normal.     Pupils: Pupils are equal, round, and reactive to light.  Neck:     Thyroid : No thyroid  mass, thyromegaly or thyroid  tenderness.  Cardiovascular:     Rate and Rhythm: Normal rate and regular rhythm.     Pulses: Normal pulses.     Heart sounds: Normal heart sounds.  Pulmonary:     Effort: Pulmonary effort is normal.     Breath sounds: Normal breath sounds.  Abdominal:     General: Bowel sounds are normal.     Palpations: Abdomen is soft.  Musculoskeletal:        General: Normal range of motion.     Cervical back: Normal range of motion and neck supple.     Right lower leg: No edema.     Left lower leg: No edema.  Lymphadenopathy:     Cervical: No cervical adenopathy.  Skin:    General: Skin is warm and dry.  Neurological:     Mental Status: She is alert and oriented to person, place, and time.     Cranial Nerves: No cranial nerve deficit.  Psychiatric:        Mood and Affect: Mood normal.        Behavior: Behavior normal.        Thought Content: Thought content normal.     No results found for any visits on 09/08/23.    Assessment & Plan:     Routine Health Maintenance and Physical Exam  Immunization History  Administered Date(s) Administered   Tdap 09/27/2019, 10/17/2020   Health Maintenance  Topic Date Due   INFLUENZA VACCINE  04/16/2024 (Originally 08/18/2023)   Hepatitis B Vaccines 19-59 Average Risk (1 of 3 - 19+ 3-dose series) 09/07/2024 (Originally 12/09/2007)   HPV VACCINES (1 - 3-dose SCDM series) 09/07/2024 (Originally 12/09/2015)   Hepatitis C Screening  09/07/2024 (Originally 12/09/2006)   HIV Screening  09/07/2024 (Originally 12/09/2003)   Cervical Cancer Screening (HPV/Pap Cotest)  02/26/2024   DTaP/Tdap/Td (3 - Td or Tdap) 10/18/2030   Pneumococcal Vaccine  Aged Out   Meningococcal B Vaccine  Aged Out   COVID-19 Vaccine  Discontinued   Discussed health benefits of physical activity, and encouraged her to engage in regular exercise appropriate for her age and condition.  Problem List Items Addressed This Visit     Obesity   Started wellness program via [facility]: exercise program, nutrition guidance, and mental health counselor. Wt Readings from Last 3 Encounters:  09/08/23 195 lb 3.2 oz (88.5 kg)  04/07/23 186 lb (84.4 kg)  02/07/23 194 lb (88 kg)         Relevant Orders   TSH   Prediabetes   Repeat hgbA1c      Relevant Orders   Hemoglobin A1c   Other Visit Diagnoses       Encounter for preventative adult health care exam with abnormal findings    -  Primary   Relevant Orders   Comprehensive metabolic panel with GFR      Return in about 1 year (around 09/07/2024) for CPE (fasting).  Roselie Mood, NP

## 2023-09-12 ENCOUNTER — Ambulatory Visit: Payer: Self-pay | Admitting: Nurse Practitioner

## 2023-09-12 DIAGNOSIS — E282 Polycystic ovarian syndrome: Secondary | ICD-10-CM

## 2023-09-12 DIAGNOSIS — Z13220 Encounter for screening for lipoid disorders: Secondary | ICD-10-CM

## 2023-09-12 DIAGNOSIS — R7303 Prediabetes: Secondary | ICD-10-CM

## 2023-10-04 MED ORDER — METFORMIN HCL ER 500 MG PO TB24: 500.0000 mg | ORAL_TABLET | Freq: Every day | ORAL | 1 refills | Status: AC

## 2023-12-29 ENCOUNTER — Ambulatory Visit: Admitting: Nurse Practitioner

## 2024-03-11 ENCOUNTER — Ambulatory Visit: Admitting: Nurse Practitioner

## 2024-09-13 ENCOUNTER — Encounter: Admitting: Nurse Practitioner
